# Patient Record
Sex: Female | Born: 1970 | Race: White | Hispanic: No | Marital: Married | Smoking: Never smoker
Health system: Southern US, Community
[De-identification: ages and names within clinical notes are randomized; demographics above are authoritative.]

## PROBLEM LIST (undated history)

## (undated) DIAGNOSIS — K603 Anal fistula, unspecified: Secondary | ICD-10-CM

## (undated) DIAGNOSIS — Z8489 Family history of other specified conditions: Secondary | ICD-10-CM

## (undated) HISTORY — PX: WISDOM TOOTH EXTRACTION: SHX21

---

## 1998-08-27 ENCOUNTER — Observation Stay (HOSPITAL_COMMUNITY): Admission: AD | Admit: 1998-08-27 | Discharge: 1998-08-27 | Payer: Self-pay | Admitting: Obstetrics & Gynecology

## 1998-11-01 ENCOUNTER — Inpatient Hospital Stay (HOSPITAL_COMMUNITY): Admission: AD | Admit: 1998-11-01 | Discharge: 1998-11-01 | Payer: Self-pay | Admitting: Obstetrics and Gynecology

## 1999-01-28 ENCOUNTER — Inpatient Hospital Stay (HOSPITAL_COMMUNITY): Admission: AD | Admit: 1999-01-28 | Discharge: 1999-01-28 | Payer: Self-pay | Admitting: Obstetrics and Gynecology

## 1999-03-31 ENCOUNTER — Inpatient Hospital Stay (HOSPITAL_COMMUNITY): Admission: AD | Admit: 1999-03-31 | Discharge: 1999-04-03 | Payer: Self-pay | Admitting: Obstetrics and Gynecology

## 1999-04-23 ENCOUNTER — Encounter (HOSPITAL_COMMUNITY): Admission: RE | Admit: 1999-04-23 | Discharge: 1999-07-22 | Payer: Self-pay | Admitting: Obstetrics and Gynecology

## 1999-05-12 ENCOUNTER — Other Ambulatory Visit: Admission: RE | Admit: 1999-05-12 | Discharge: 1999-05-12 | Payer: Self-pay | Admitting: Obstetrics and Gynecology

## 2000-05-17 ENCOUNTER — Other Ambulatory Visit: Admission: RE | Admit: 2000-05-17 | Discharge: 2000-05-17 | Payer: Self-pay | Admitting: Obstetrics and Gynecology

## 2001-06-14 ENCOUNTER — Other Ambulatory Visit: Admission: RE | Admit: 2001-06-14 | Discharge: 2001-06-14 | Payer: Self-pay | Admitting: Obstetrics and Gynecology

## 2001-07-13 ENCOUNTER — Ambulatory Visit (HOSPITAL_COMMUNITY): Admission: AD | Admit: 2001-07-13 | Discharge: 2001-07-13 | Payer: Self-pay | Admitting: *Deleted

## 2001-07-13 ENCOUNTER — Encounter (INDEPENDENT_AMBULATORY_CARE_PROVIDER_SITE_OTHER): Payer: Self-pay

## 2001-07-13 HISTORY — PX: DILATION AND EVACUATION: SHX1459

## 2002-07-01 ENCOUNTER — Inpatient Hospital Stay (HOSPITAL_COMMUNITY): Admission: AD | Admit: 2002-07-01 | Discharge: 2002-07-04 | Payer: Self-pay | Admitting: Obstetrics and Gynecology

## 2002-08-08 ENCOUNTER — Other Ambulatory Visit: Admission: RE | Admit: 2002-08-08 | Discharge: 2002-08-08 | Payer: Self-pay | Admitting: Obstetrics and Gynecology

## 2003-08-22 ENCOUNTER — Other Ambulatory Visit: Admission: RE | Admit: 2003-08-22 | Discharge: 2003-08-22 | Payer: Self-pay | Admitting: Obstetrics and Gynecology

## 2005-06-09 ENCOUNTER — Other Ambulatory Visit: Admission: RE | Admit: 2005-06-09 | Discharge: 2005-06-09 | Payer: Self-pay | Admitting: Obstetrics and Gynecology

## 2009-12-11 ENCOUNTER — Encounter: Admission: RE | Admit: 2009-12-11 | Discharge: 2009-12-11 | Payer: Self-pay | Admitting: Family Medicine

## 2011-01-16 NOTE — Op Note (Signed)
[facility]  Patient:    Brenda Flowers, Brenda Flowers Visit Number: [id number] MRN: [id number]          Service Type: DSU Location: [facility] Attending Physician:  Donne Hazel Dictated by:   Trevor Iha, M.D. Proc. Date: 07/13/01 Admit Date:  07/13/2001                             Operative Report  PREOPERATIVE DIAGNOSES:       Incomplete abortion [redacted] weeks gestational age.  POSTOPERATIVE DIAGNOSES:      Incomplete abortion [redacted] weeks gestational age.  PROCEDURE:                    Dilatation and evacuation.  SURGEON:                      Trevor Iha, M.D.  ANESTHESIA:                   Monitored anesthetic care.  INDICATIONS:                  [name] is a 40 year old G3, P2 at 65 1/2 weeks estimated gestational age who presented with bright bleeding, cramping, and passing of clots.  The passed the fetus upon arrival to the emergency room.  Continued to have heavy bleeding.  Because of incomplete abortion she was dilated 2 cm, we recommended she proceed with dilatation and evacuation for the incomplete abortion.  Risks and benefits were discussed at length. Informed consent was obtained.  Her pregnancy has been complicated by a positive toxoplasmosis titer and infection and also with vaginal bleeding.  DESCRIPTION OF PROCEDURE:     Approximately 300 cc was noted before the procedure of blood loss.  Upon taking her to the operating room and after adequate analgesia with the patient placed in the dorsal lithotomy position she is sterilely prepped and draped.  The bladder is sterilely drained. Approximately 300-400 cc more was evacuated from the vagina.  After sterile prepping and draping some tissue was removed from the cervix.  Polyp forceps were used to grasp remaining endometrial placental tissue.  This was followed by suction curettage with 8 mm suction curette retrieving products of conception.  This is performed until the endometrial  cavity is felt to be emptied as evidenced by a gritty surface felt throughout the endometrial cavity.  At this point methergine 0.2 mg is given to the patient IM with good uterine response.  At this time felt endometrial cavity was empty.  The suction curette was removed.  The sponge forceps was removed from the anterior lip of the cervix and noted to be hemostatic.  The Graves speculum is then removed.  The patient is transferred to the recovery room in stable condition. Estimated blood loss total was 300 cc before procedure, 400 cc noted at the time of the procedure for a total of 700 cc blood loss.  She was in stable condition in the PACU and will be discharged home.  DISPOSITION:                  Patient will be discharged home.  Will follow up in the office in two to three weeks.  She is sent home with a routine instruction sheet for D&C and a prescription for methergine 0.2 mg to take q.8h. for three days and doxycycline 100 mg p.o. b.i.d. x  7 days. Dictated by:   Trevor Iha, M.D. Attending Physician:  Donne Hazel DD:  07/13/01 TD:  07/13/01 Job: [id number]

## 2011-09-14 DIAGNOSIS — F988 Other specified behavioral and emotional disorders with onset usually occurring in childhood and adolescence: Secondary | ICD-10-CM | POA: Insufficient documentation

## 2013-11-02 DIAGNOSIS — J45990 Exercise induced bronchospasm: Secondary | ICD-10-CM | POA: Insufficient documentation

## 2014-11-14 ENCOUNTER — Encounter: Payer: Self-pay | Admitting: Sports Medicine

## 2014-11-14 ENCOUNTER — Ambulatory Visit (INDEPENDENT_AMBULATORY_CARE_PROVIDER_SITE_OTHER): Payer: BLUE CROSS/BLUE SHIELD | Admitting: Sports Medicine

## 2014-11-14 ENCOUNTER — Ambulatory Visit
Admission: RE | Admit: 2014-11-14 | Discharge: 2014-11-14 | Disposition: A | Discharge status: Home / Self Care | Payer: BLUE CROSS/BLUE SHIELD | Source: Ambulatory Visit | Attending: Sports Medicine | Admitting: Sports Medicine

## 2014-11-14 VITALS — BP 101/70 | Ht 68.0 in | Wt 160.0 lb

## 2014-11-14 DIAGNOSIS — M2170 Unequal limb length (acquired), unspecified site: Secondary | ICD-10-CM

## 2014-11-14 DIAGNOSIS — R1031 Right lower quadrant pain: Secondary | ICD-10-CM

## 2014-11-14 DIAGNOSIS — M25551 Pain in right hip: Secondary | ICD-10-CM

## 2014-11-14 DIAGNOSIS — R269 Unspecified abnormalities of gait and mobility: Secondary | ICD-10-CM

## 2014-11-14 DIAGNOSIS — M79604 Pain in right leg: Secondary | ICD-10-CM

## 2014-11-14 DIAGNOSIS — R109 Unspecified abdominal pain: Secondary | ICD-10-CM

## 2014-11-14 DIAGNOSIS — G8929 Other chronic pain: Secondary | ICD-10-CM | POA: Insufficient documentation

## 2014-11-14 NOTE — Assessment & Plan Note (Signed)
Soft foam correction added to left insole This was comfortabel with running

## 2014-11-14 NOTE — Assessment & Plan Note (Signed)
I think this is being triggered by biomechanical issues  XRay looks pretty good and I think intra-articular pathology less likely

## 2014-11-14 NOTE — Assessment & Plan Note (Signed)
Leg length corection and bilat 5th ray posts added to sports insoles  After this her gait looked improved with less turnout  Add line drills  Use correction x 1 month and monitor sxs  Reck after 1 month  May require custom orthotics

## 2014-11-14 NOTE — Progress Notes (Signed)
Patient ID: Brenda Flowers, female   DOB: 06/13/1971, 44 y.o.   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 first year 2 stress fractures on RT tibia anterior Year 4 had PF on RT and took a long time/ boot / finally heeled and no pain after 2 years ago had femoral neck stress fx on RT/ crutches 2 weeks/ total rest 10 weeks  Mileage 25 per week/ 40 when doing marathon 4 days running w long run 8 to [employer]14 Cross train on pure bar Lifts - mainly upper body  Now has pain in groin at night/ stretching makes that worse Last week on 11 mile run had groin pain at mile 8  Sitting makes stiff/ riding in a car/ getting out of car Some stiffness in morning  Premenopausal with periods twice monthly x 3 mos/ HGB and Iron were OK  Fam HX Dad has OA of knees  WzM NAD/ MUSCULAR BP 101/70 mmHg  Ht 5\' 8"  (1.727 m)  Wt 160 lb (72.576 kg)  BMI 24.33 kg/m2  LMP 11/07/2014 (Approximate)  RT leg is 1 cm longer No scoliosis  RT SIJ is tight No hernia on RT No pain over symphysis  Hip rotatoin is full bilaterally Slt groin pain on RT on FADIR but not clinically impressive Strength excellent on all hip MM groups Hop test neg  Running gait shows bilat fore foot varus foot strike She hits with slt turnout of both feet Some ER of RT hip with running  XR Calcified bone island? Thickening of RT fem cortex

## 2014-12-20 ENCOUNTER — Encounter: Payer: Self-pay | Admitting: Sports Medicine

## 2014-12-20 ENCOUNTER — Ambulatory Visit (INDEPENDENT_AMBULATORY_CARE_PROVIDER_SITE_OTHER): Payer: BLUE CROSS/BLUE SHIELD | Admitting: Sports Medicine

## 2014-12-20 VITALS — BP 101/66 | Ht 68.0 in | Wt 150.0 lb

## 2014-12-20 DIAGNOSIS — G8929 Other chronic pain: Secondary | ICD-10-CM

## 2014-12-20 DIAGNOSIS — R109 Unspecified abdominal pain: Secondary | ICD-10-CM

## 2014-12-20 DIAGNOSIS — M25551 Pain in right hip: Secondary | ICD-10-CM | POA: Diagnosis not present

## 2014-12-20 DIAGNOSIS — R1031 Right lower quadrant pain: Secondary | ICD-10-CM

## 2014-12-20 MED ORDER — MELOXICAM 15 MG PO TABS: 15.0000 mg | ORAL_TABLET | Freq: Every day | ORAL | Status: DC | PRN

## 2014-12-20 NOTE — Assessment & Plan Note (Signed)
MRI to reevaluate for potential recurrent stress fracture versus AVN. Will call patient with the results of the MRI to discuss options at that time. She will likely need some bio mechanical modifications to her stride as well as shoes  & will reevaluate after the MRI.

## 2014-12-20 NOTE — Progress Notes (Signed)
  Brenda GarbeCherie N Flowers - 44 y.o. female MRN [id number]  Date of birth: 07-03-1971  SUBJECTIVE: CC: 1. Right Leg and Hip Pain, follow up     HPI:   previously seen 1 month ago for symptoms that are "the same"  Pain is worse at the end of the long run  Located circumferentially around the proximal thigh  No significant radiation  Pain does not awaken her from sleep. She denies any fevers, chills or recent weight gain or weight loss. Pain is worse with forward flexion and 1 footed standing at the end of the long run.  Previously pain was controlled with Celebrex but no significant change with current Aleeve     ROS:  per HPI    HISTORY:  Past Medical, Surgical, Social, and Family History reviewed & updated per EMR.  Pertinent Historical Findings include: Social History   Occupational History  .      [employer]   Social History Main Topics  . Smoking status: Never Smoker   . Smokeless tobacco: Not on file  . Alcohol Use: Not on file  . Drug Use: Not on file  . Sexual Activity: Not on file    Avid runner, Works at [employer] Has completed multiple half and full marathons  OBJECTIVE:  VS:   HT:5\' 8"  (172.7 cm)   WT:150 lb (68.04 kg)  BMI:22.9          BP:101/66 mmHg  HR: bpm  TEMP: ( )  RESP:   PHYSICAL EXAM: GENERAL: Adult Caucasian female. No acute distress PSYCH: Alert and appropriately interactive. SKIN: No open skin lesions or abnormal skin markings on areas inspected as below VASCULAR: DP and PT pulses 2+/4 NEURO: Lower extremity strength is 5+/5 in all myotomes; sensation is intact to light touch in all dermatomes. RIGHT HIP: Overall normally aligned. She does have a small amount of swelling over the proximal lateral thigh. Noted tenderness palpation in this area. TTP over rectus femoris origin and TFL. No significant TTP over greater trochanter, piriformis. Normal internal and external rotation of bilateral hips. Negative log roll. Positive Faber on the  right, slight pain with terminal FADIR. Popliteal angle of 70 on right, 90 on left. Positive fulcrum test. GAIT: Toe striker with a wide-based stance, does not cross midline. No significant Trendelenburg.  DATA OBTAINED: No notes on file   ASSESSMENT & PLAN: See problem based charting & AVS for additional documentation Problem List Items Addressed This Visit    Groin pain, chronic, right    MRI to reevaluate for potential recurrent stress fracture versus AVN. Will call patient with the results of the MRI to discuss options at that time. She will likely need some bio mechanical modifications to her stride as well as shoes  & will reevaluate after the MRI.      Relevant Medications   meloxicam (MOBIC) 15 MG tablet    Other Visit Diagnoses    Right hip pain    -  Primary    Relevant Medications    meloxicam (MOBIC) 15 MG tablet    Other Relevant Orders    MR Hip Right Wo Contrast       FOLLOW UP:  Return for Will call with results.

## 2014-12-24 ENCOUNTER — Ambulatory Visit (INDEPENDENT_AMBULATORY_CARE_PROVIDER_SITE_OTHER): Payer: BLUE CROSS/BLUE SHIELD | Admitting: Physician Assistant

## 2014-12-24 VITALS — BP 110/66 | HR 69 | Temp 98.9°F | Resp 16 | Ht 69.5 in | Wt 151.4 lb

## 2014-12-24 DIAGNOSIS — K645 Perianal venous thrombosis: Secondary | ICD-10-CM | POA: Diagnosis not present

## 2014-12-24 DIAGNOSIS — K612 Anorectal abscess: Secondary | ICD-10-CM | POA: Diagnosis not present

## 2014-12-24 MED ORDER — CIPROFLOXACIN HCL 500 MG PO TABS
500.0000 mg | ORAL_TABLET | Freq: Two times a day (BID) | ORAL | Status: DC
Start: 2014-12-24 — End: 2015-02-19

## 2014-12-24 MED ORDER — NITROGLYCERIN 0.4 % RE OINT: TOPICAL_OINTMENT | RECTAL | Status: DC

## 2014-12-24 MED ORDER — HYDROCORTISONE 2.5 % RE CREA: 1.0000 "application " | TOPICAL_CREAM | Freq: Two times a day (BID) | RECTAL | Status: DC

## 2014-12-24 MED ORDER — HYDROCODONE-ACETAMINOPHEN 5-325 MG PO TABS: 1.0000 | ORAL_TABLET | Freq: Four times a day (QID) | ORAL | Status: DC | PRN

## 2014-12-24 NOTE — Patient Instructions (Signed)
Please apply the steroid suppository twice daily and the nitro topical twice daily.  Please take the cipro twice daily for 10 days.  Please take the norco as needed. If you start feeling nauseated from this please let us know and I'll send in a different pain medication for you. Please come back to see me on Wednesday. We'll assess at that point and determine the next step.   Hemorrhoids Hemorrhoids are swollen veins around the rectum or anus. There are two types of hemorrhoids:   Internal hemorrhoids. These occur in the veins just inside the rectum. They may poke through to the outside and become irritated and painful.  External hemorrhoids. These occur in the veins outside the anus and can be felt as a painful swelling or hard lump near the anus. CAUSES  Pregnancy.   Obesity.   Constipation or diarrhea.   Straining to have a bowel movement.   Sitting for long periods on the toilet.  Heavy lifting or other activity that caused you to strain.  Anal intercourse. SYMPTOMS   Pain.   Anal itching or irritation.   Rectal bleeding.   Fecal leakage.   Anal swelling.   One or more lumps around the anus.  DIAGNOSIS  Your caregiver may be able to diagnose hemorrhoids by visual examination. Other examinations or tests that may be performed include:   Examination of the rectal area with a gloved hand (digital rectal exam).   Examination of anal canal using a small tube (scope).   A blood test if you have lost a significant amount of blood.  A test to look inside the colon (sigmoidoscopy or colonoscopy). TREATMENT Most hemorrhoids can be treated at home. However, if symptoms do not seem to be getting better or if you have a lot of rectal bleeding, your caregiver may perform a procedure to help make the hemorrhoids get smaller or remove them completely. Possible treatments include:   Placing a rubber band at the base of the hemorrhoid to cut off the circulation  (rubber band ligation).   Injecting a chemical to shrink the hemorrhoid (sclerotherapy).   Using a tool to burn the hemorrhoid (infrared light therapy).   Surgically removing the hemorrhoid (hemorrhoidectomy).   Stapling the hemorrhoid to block blood flow to the tissue (hemorrhoid stapling).  HOME CARE INSTRUCTIONS   Eat foods with fiber, such as whole grains, beans, nuts, fruits, and vegetables. Ask your doctor about taking products with added fiber in them (fibersupplements).  Increase fluid intake. Drink enough water and fluids to keep your urine clear or pale yellow.   Exercise regularly.   Go to the bathroom when you have the urge to have a bowel movement. Do not wait.   Avoid straining to have bowel movements.   Keep the anal area dry and clean. Use wet toilet paper or moist towelettes after a bowel movement.   Medicated creams and suppositories may be used or applied as directed.   Only take over-the-counter or prescription medicines as directed by your caregiver.   Take warm sitz baths for 15-20 minutes, 3-4 times a day to ease pain and discomfort.   Place ice packs on the hemorrhoids if they are tender and swollen. Using ice packs between sitz baths may be helpful.   Put ice in a plastic bag.   Place a towel between your skin and the bag.   Leave the ice on for 15-20 minutes, 3-4 times a day.   Do not use a donut-shaped pillow  or sit on the toilet for long periods. This increases blood pooling and pain.  SEEK MEDICAL CARE IF:  You have increasing pain and swelling that is not controlled by treatment or medicine.  You have uncontrolled bleeding.  You have difficulty or you are unable to have a bowel movement.  You have pain or inflammation outside the area of the hemorrhoids. MAKE SURE YOU:  Understand these instructions.  Will watch your condition.  Will get help right away if you are not doing well or get worse. Document Released:  08/14/2000 Document Revised: 08/03/2012 Document Reviewed: 06/21/2012 [facility] Patient Information 2015 [place], [place]. This information is not intended to replace advice given to you by your health care provider. Make sure you discuss any questions you have with your health care provider.

## 2014-12-24 NOTE — Progress Notes (Signed)
   Subjective:    Patient ID: Brenda Flowers, female    DOB: [birth year], 44 y.o.   MRN: [id number]  Chief Complaint  Patient presents with  . Rectal Pain    worse after using the bathroom. x 1 month    Patient Active Problem List   Diagnosis Date Noted  . Groin pain, chronic, right 11/14/2014  . Leg length inequality 11/14/2014  . Abnormality of gait 11/14/2014  . Asthma, exercise induced 11/02/2013  . ADD (attention deficit disorder) 09/14/2011   Medications, allergies, past medical history, surgical history, family history, social history and problem list reviewed and updated.  HPI  [age] yof with no pertinent pmh presents with rectal pain.   Sx intermittent for approx 6 wks. Gets severe pain/burning sensation on right side anus with BMs, leaning back while seated, or with certain movements.   Intermittent. When sx come on typically last for several mins-hour. Having BMs is the most painful. Has had hemorrhoids in distant past when pregnant, feels similar but more painful now. Doing sitz baths without relief. Taking meloxicam for hip pain which doesn't help with the anal pain.   Denies hx IBD, blood in stool, diarrhea, fevers, chills, vaginal dc.   Review of Systems See HPI.     Objective:   Physical Exam  Constitutional: She is oriented to person, place, and time. She appears well-developed and well-nourished.  Non-toxic appearance. She does not have a sickly appearance. She does not appear ill. No distress.  BP 110/66 mmHg  Pulse 69  Temp(Src) 98.9 F (37.2 C) (Oral)  Resp 16  Ht 5' 9.5" (1.765 m)  Wt 151 lb 6.4 oz (68.675 kg)  BMI 22.04 kg/m2  SpO2 98%  LMP 12/11/2014   Genitourinary: Rectal exam shows external hemorrhoid and tenderness. Rectal exam shows no fissure and anal tone normal.  External hemorrhoid extending from 2:00 to 6:00 around anus. Two small thrombus palpable in hemorrhoid very ttp. Area of tenderness and mild induration extending from proximal to anus  into anal canal. No fluctuance. No purulence. No erythema. Normal anal tone. No fissure. No ttp over sacrum.   Neurological: She is alert and oriented to person, place, and time.  Psychiatric: She has a normal mood and affect. Her speech is normal and behavior is normal.      Assessment & Plan:   [age] yof with no pertinent pmh presents with rectal pain.   External hemorrhoid, thrombosed - Plan: hydrocortisone (ANUSOL-HC) 2.5 % rectal cream, Nitroglycerin 0.4 % OINT, HYDROcodone-acetaminophen (NORCO) 5-325 MG per tablet --Two small thrombus in external hemorrhoid --continue sitz baths, no lingering on toilet, colace --hydrocortisone suppository bid, nitro ointment bid --norco prn --> pt to call if n/v with med as has had previously but not every time with opiates --rtc 48 hrs for eval, no indication for incision at this time   Abscess of anal and rectal regions - Plan: ciprofloxacin (CIPRO) 500 MG tablet, HYDROcodone-acetaminophen (NORCO) 5-325 MG per tablet --possible abscess near anus with induration and ttp, no fluctuance --no fevers, chills --cipro bid 10 days --rtc 48 hrs for eval, if progressing possible surgical referral at that time  Donnajean Lopesodd M. Lucille Crichlow, PA-C Physician Assistant-Certified [facility]  12/24/2014 9:16 PM

## 2014-12-26 ENCOUNTER — Ambulatory Visit
Admission: RE | Admit: 2014-12-26 | Discharge: 2014-12-26 | Disposition: A | Discharge status: Home / Self Care | Payer: BLUE CROSS/BLUE SHIELD | Source: Ambulatory Visit | Attending: Sports Medicine | Admitting: Sports Medicine

## 2014-12-26 DIAGNOSIS — M25551 Pain in right hip: Secondary | ICD-10-CM

## 2014-12-30 ENCOUNTER — Telehealth: Payer: Self-pay

## 2014-12-30 NOTE — Telephone Encounter (Signed)
Pt was seen by Raelyn Ensigntodd [email or web address] [phone] for a hemorrhoid and she wanted to let him know that the rx he prescribed has provided her with some relief, however she still occasionally experiences some painful episodes. She wants to know if Tawanna Coolerodd recommends that she RTC to receive further tx or if she should let the tx run its course.

## 2015-01-04 ENCOUNTER — Ambulatory Visit: Payer: BLUE CROSS/BLUE SHIELD | Admitting: Sports Medicine

## 2015-01-23 ENCOUNTER — Ambulatory Visit: Payer: BLUE CROSS/BLUE SHIELD | Admitting: Sports Medicine

## 2015-02-05 ENCOUNTER — Ambulatory Visit (INDEPENDENT_AMBULATORY_CARE_PROVIDER_SITE_OTHER): Payer: BLUE CROSS/BLUE SHIELD | Admitting: Sports Medicine

## 2015-02-05 ENCOUNTER — Encounter: Payer: Self-pay | Admitting: Sports Medicine

## 2015-02-05 VITALS — BP 112/70 | Ht 68.0 in | Wt 145.0 lb

## 2015-02-05 DIAGNOSIS — M76899 Other specified enthesopathies of unspecified lower limb, excluding foot: Secondary | ICD-10-CM | POA: Insufficient documentation

## 2015-02-05 DIAGNOSIS — M6580 Other synovitis and tenosynovitis, unspecified site: Secondary | ICD-10-CM | POA: Diagnosis not present

## 2015-02-05 MED ORDER — NITROGLYCERIN 0.2 MG/HR TD PT24: MEDICATED_PATCH | TRANSDERMAL | Status: DC

## 2015-02-05 NOTE — Progress Notes (Signed)
Patient ID: Brenda Flowers, female   DOB: 01/02/1971, 44 y.o.   MRN: [id number]   HPI  Patient presents today for follow up R hip/hamstring pain  Patient explains approx 1 year of perceived circumfirential R hip pain located in R lateral hip and R lower buttock. It is worse with running intermittently and she has stiffness after prolonged sitting. She has tried a thigh sleeve which helps but doesn't seem to be high enough  Since her last visit she states that her pain is stable to slightly improved.   She also notes an indention in her R anterolateral thigh which ahs been there several months. The pain seems to be originating from that area  She has had an MRI since last visit and continues to run approx 20-25 miles per week  MRI revealed an unremarkable hip but a small proximal right hamstring tear  ROS: Per HPI  Objective: BP 112/70 mmHg  Ht 5\' 8"  (1.727 m)  Wt 145 lb (65.772 kg)  BMI 22.05 kg/m2 Gen: NAD, alert, cooperative with exam HEENT: NCAT, EOMI, PERRL Ext: No edema, warm Neuro: Alert and oriented, No gross deficits R hip Strength 5/5 in all areas except 3-4/5 in stressing the hamstring on the R H Test is limited by about 10 on the right Full ROM BL hips Negative FABER  Assessment and plan:  Hamstring tendinitis at origin Continued but improved pain MRI with partial tear at insertion, clinically hamstring tendinopathy Given HEP Nitro patch, f/u 4-6 weeks      Meds ordered this encounter  Medications  . nitroGLYCERIN (NITRODUR - DOSED IN MG/24 HR) 0.2 mg/hr patch    Sig: Use 1/4 patch daily to the affected area    Dispense:  10 patch    Refill:  1  . methylphenidate 54 MG PO CR tablet    Sig: Take 54 mg by mouth.

## 2015-02-05 NOTE — Patient Instructions (Addendum)
  Exercises,  4 sets of 8 each: Extender exercises  Diver exercise  3 sets of 15 Runner's lunge and reverse lunge    Nitroglycerin Protocol   Apply 1/4 nitroglycerin patch to affected area daily.  Change position of patch within the affected area every 24 hours.  You may experience a headache during the first 1-2 weeks of using the patch, these should subside.  If you experience headaches after beginning nitroglycerin patch treatment, you may take your preferred over the counter pain reliever.  Another side effect of the nitroglycerin patch is skin irritation or rash related to patch adhesive.  Please notify our office if you develop more severe headaches or rash, and stop the patch.  Tendon healing with nitroglycerin patch may require 12 to 24 weeks depending on the extent of injury.  Men should not use if taking Viagra, Cialis, or Levitra.   Do not use if you have migraines or rosacea.

## 2015-02-05 NOTE — Assessment & Plan Note (Addendum)
Continued but improved pain MRI with partial tear at insertion, clinically hamstring tendinopathy Given HEP Nitro patch, f/u 4-6 weeks

## 2015-02-18 ENCOUNTER — Inpatient Hospital Stay (HOSPITAL_COMMUNITY)
Admission: EM | Admit: 2015-02-18 | Discharge: 2015-02-19 | DRG: 395 | Disposition: A | Discharge status: Home / Self Care | Payer: BLUE CROSS/BLUE SHIELD | Attending: Surgery | Admitting: Surgery

## 2015-02-18 ENCOUNTER — Encounter (HOSPITAL_COMMUNITY): Admission: EM | Disposition: A | Discharge status: Home / Self Care | Payer: Self-pay | Source: Home / Self Care

## 2015-02-18 ENCOUNTER — Emergency Department (HOSPITAL_COMMUNITY): Payer: BLUE CROSS/BLUE SHIELD

## 2015-02-18 ENCOUNTER — Encounter (HOSPITAL_COMMUNITY): Payer: Self-pay | Admitting: Emergency Medicine

## 2015-02-18 ENCOUNTER — Observation Stay (HOSPITAL_COMMUNITY): Payer: BLUE CROSS/BLUE SHIELD | Admitting: Registered Nurse

## 2015-02-18 DIAGNOSIS — Z809 Family history of malignant neoplasm, unspecified: Secondary | ICD-10-CM

## 2015-02-18 DIAGNOSIS — K611 Rectal abscess: Secondary | ICD-10-CM | POA: Diagnosis present

## 2015-02-18 HISTORY — PX: INCISION AND DRAINAGE PERIRECTAL ABSCESS: SHX1804

## 2015-02-18 LAB — I-STAT CHEM 8, ED
BUN: 9 mg/dL (ref 6–20)
CHLORIDE: 105 mmol/L (ref 101–111)
CREATININE: 0.7 mg/dL (ref 0.44–1.00)
Calcium, Ion: 1.14 mmol/L (ref 1.12–1.23)
Glucose, Bld: 105 mg/dL — ABNORMAL HIGH (ref 65–99)
HCT: 37 % (ref 36.0–46.0)
Hemoglobin: 12.6 g/dL (ref 12.0–15.0)
Potassium: 3.5 mmol/L (ref 3.5–5.1)
Sodium: 138 mmol/L (ref 135–145)
TCO2: 20 mmol/L (ref 0–100)

## 2015-02-18 LAB — URINALYSIS, ROUTINE W REFLEX MICROSCOPIC
Bilirubin Urine: NEGATIVE
GLUCOSE, UA: NEGATIVE mg/dL
HGB URINE DIPSTICK: NEGATIVE
KETONES UR: NEGATIVE mg/dL
Leukocytes, UA: NEGATIVE
NITRITE: NEGATIVE
PROTEIN: NEGATIVE mg/dL
Specific Gravity, Urine: 1.018 (ref 1.005–1.030)
Urobilinogen, UA: 0.2 mg/dL (ref 0.0–1.0)
pH: 6 (ref 5.0–8.0)

## 2015-02-18 LAB — PREGNANCY, URINE: Preg Test, Ur: NEGATIVE

## 2015-02-18 LAB — CBC
HCT: 35.2 % — ABNORMAL LOW (ref 36.0–46.0)
Hemoglobin: 11.8 g/dL — ABNORMAL LOW (ref 12.0–15.0)
MCH: 30.4 pg (ref 26.0–34.0)
MCHC: 33.5 g/dL (ref 30.0–36.0)
MCV: 90.7 fL (ref 78.0–100.0)
PLATELETS: 265 10*3/uL (ref 150–400)
RBC: 3.88 MIL/uL (ref 3.87–5.11)
RDW: 12.8 % (ref 11.5–15.5)
WBC: 13.4 10*3/uL — ABNORMAL HIGH (ref 4.0–10.5)

## 2015-02-18 LAB — SURGICAL PCR SCREEN
MRSA, PCR: NEGATIVE
Staphylococcus aureus: NEGATIVE

## 2015-02-18 SURGERY — INCISION AND DRAINAGE, ABSCESS, PERIRECTAL
Anesthesia: General

## 2015-02-18 MED ORDER — KCL IN DEXTROSE-NACL 20-5-0.45 MEQ/L-%-% IV SOLN
INTRAVENOUS | Status: AC
  Filled 2015-02-18: qty 1000

## 2015-02-18 MED ORDER — ACETAMINOPHEN 325 MG PO TABS
650.0000 mg | ORAL_TABLET | Freq: Four times a day (QID) | ORAL | Status: DC | PRN
  Administered 2015-02-18 – 2015-02-19 (×2): 650 mg via ORAL
  Filled 2015-02-18 (×2): qty 2

## 2015-02-18 MED ORDER — BUPIVACAINE-EPINEPHRINE 0.5% -1:200000 IJ SOLN
INTRAMUSCULAR | Status: DC | PRN
  Administered 2015-02-18: 8 mL

## 2015-02-18 MED ORDER — ONDANSETRON HCL 4 MG/2ML IJ SOLN
4.0000 mg | Freq: Once | INTRAMUSCULAR | Status: AC
  Administered 2015-02-18: 4 mg via INTRAVENOUS
  Filled 2015-02-18: qty 2

## 2015-02-18 MED ORDER — KCL IN DEXTROSE-NACL 20-5-0.45 MEQ/L-%-% IV SOLN
INTRAVENOUS | Status: DC
  Administered 2015-02-19: via INTRAVENOUS
  Filled 2015-02-18 (×4): qty 1000

## 2015-02-18 MED ORDER — HYDROMORPHONE HCL 1 MG/ML IJ SOLN
INTRAMUSCULAR | Status: AC
  Filled 2015-02-18: qty 1

## 2015-02-18 MED ORDER — SODIUM CHLORIDE 0.9 % IV BOLUS (SEPSIS)
1000.0000 mL | Freq: Once | INTRAVENOUS | Status: AC
Start: 2015-02-18 — End: 2015-02-18
  Administered 2015-02-18: 1000 mL via INTRAVENOUS

## 2015-02-18 MED ORDER — NEOSTIGMINE METHYLSULFATE 10 MG/10ML IV SOLN
INTRAVENOUS | Status: DC | PRN
  Administered 2015-02-18: 3.5 mg via INTRAVENOUS

## 2015-02-18 MED ORDER — KCL IN DEXTROSE-NACL 20-5-0.45 MEQ/L-%-% IV SOLN
INTRAVENOUS | Status: DC
  Filled 2015-02-18 (×2): qty 1000

## 2015-02-18 MED ORDER — GLYCOPYRROLATE 0.2 MG/ML IJ SOLN
INTRAMUSCULAR | Status: DC | PRN
  Administered 2015-02-18: .7 mg via INTRAVENOUS

## 2015-02-18 MED ORDER — ONDANSETRON HCL 4 MG/2ML IJ SOLN
INTRAMUSCULAR | Status: AC
  Filled 2015-02-18: qty 2

## 2015-02-18 MED ORDER — LACTATED RINGERS IV SOLN
INTRAVENOUS | Status: DC | PRN
  Administered 2015-02-18: 09:00:00 via INTRAVENOUS

## 2015-02-18 MED ORDER — SUCCINYLCHOLINE CHLORIDE 20 MG/ML IJ SOLN
INTRAMUSCULAR | Status: DC | PRN
  Administered 2015-02-18: 100 mg via INTRAVENOUS

## 2015-02-18 MED ORDER — PROPOFOL 10 MG/ML IV BOLUS
INTRAVENOUS | Status: DC | PRN
  Administered 2015-02-18: 170 mg via INTRAVENOUS

## 2015-02-18 MED ORDER — GLYCOPYRROLATE 0.2 MG/ML IJ SOLN
INTRAMUSCULAR | Status: AC
  Filled 2015-02-18: qty 3

## 2015-02-18 MED ORDER — ROCURONIUM BROMIDE 100 MG/10ML IV SOLN
INTRAVENOUS | Status: AC
  Filled 2015-02-18: qty 1

## 2015-02-18 MED ORDER — MORPHINE SULFATE 4 MG/ML IJ SOLN
4.0000 mg | Freq: Once | INTRAMUSCULAR | Status: AC
  Administered 2015-02-18: 4 mg via INTRAVENOUS
  Filled 2015-02-18: qty 1

## 2015-02-18 MED ORDER — SODIUM CHLORIDE 0.9 % IV SOLN
3.0000 g | Freq: Four times a day (QID) | INTRAVENOUS | Status: DC
  Administered 2015-02-18 – 2015-02-19 (×5): 3 g via INTRAVENOUS
  Filled 2015-02-18 (×7): qty 3

## 2015-02-18 MED ORDER — NEOSTIGMINE METHYLSULFATE 10 MG/10ML IV SOLN
INTRAVENOUS | Status: AC
  Filled 2015-02-18: qty 1

## 2015-02-18 MED ORDER — LIDOCAINE HCL (CARDIAC) 20 MG/ML IV SOLN
INTRAVENOUS | Status: AC
  Filled 2015-02-18: qty 5

## 2015-02-18 MED ORDER — PROPOFOL 10 MG/ML IV BOLUS
INTRAVENOUS | Status: AC
  Filled 2015-02-18: qty 20

## 2015-02-18 MED ORDER — FENTANYL CITRATE (PF) 100 MCG/2ML IJ SOLN
INTRAMUSCULAR | Status: DC | PRN
  Administered 2015-02-18: 50 ug via INTRAVENOUS

## 2015-02-18 MED ORDER — MORPHINE SULFATE 2 MG/ML IJ SOLN
1.0000 mg | INTRAMUSCULAR | Status: DC | PRN
  Administered 2015-02-18 – 2015-02-19 (×5): 2 mg via INTRAVENOUS
  Filled 2015-02-18 (×6): qty 1

## 2015-02-18 MED ORDER — ROCURONIUM BROMIDE 100 MG/10ML IV SOLN
INTRAVENOUS | Status: DC | PRN
  Administered 2015-02-18: 20 mg via INTRAVENOUS

## 2015-02-18 MED ORDER — MIDAZOLAM HCL 5 MG/5ML IJ SOLN
INTRAMUSCULAR | Status: DC | PRN
Start: 2015-02-18 — End: 2015-02-18
  Administered 2015-02-18: 2 mg via INTRAVENOUS

## 2015-02-18 MED ORDER — IOHEXOL 300 MG/ML  SOLN
100.0000 mL | Freq: Once | INTRAMUSCULAR | Status: AC | PRN
  Administered 2015-02-18: 80 mL via INTRAVENOUS

## 2015-02-18 MED ORDER — MIDAZOLAM HCL 2 MG/2ML IJ SOLN
INTRAMUSCULAR | Status: AC
  Filled 2015-02-18: qty 2

## 2015-02-18 MED ORDER — LIDOCAINE HCL (CARDIAC) 20 MG/ML IV SOLN
INTRAVENOUS | Status: DC | PRN
  Administered 2015-02-18: 100 mg via INTRAVENOUS

## 2015-02-18 MED ORDER — ACETAMINOPHEN 650 MG RE SUPP: 650.0000 mg | Freq: Four times a day (QID) | RECTAL | Status: DC | PRN

## 2015-02-18 MED ORDER — BUPIVACAINE-EPINEPHRINE (PF) 0.5% -1:200000 IJ SOLN
INTRAMUSCULAR | Status: AC
  Filled 2015-02-18: qty 30

## 2015-02-18 MED ORDER — FENTANYL CITRATE (PF) 250 MCG/5ML IJ SOLN
INTRAMUSCULAR | Status: AC
  Filled 2015-02-18: qty 5

## 2015-02-18 MED ORDER — HYDROMORPHONE HCL 1 MG/ML IJ SOLN
0.2500 mg | INTRAMUSCULAR | Status: DC | PRN
  Administered 2015-02-18: 0.5 mg via INTRAVENOUS
  Administered 2015-02-18: 11:00:00 via INTRAVENOUS
  Administered 2015-02-18 (×2): 0.5 mg via INTRAVENOUS
  Administered 2015-02-18: 11:00:00 via INTRAVENOUS

## 2015-02-18 MED ORDER — ONDANSETRON HCL 4 MG/2ML IJ SOLN
INTRAMUSCULAR | Status: DC | PRN
  Administered 2015-02-18: 4 mg via INTRAVENOUS

## 2015-02-18 SURGICAL SUPPLY — 27 items
BLADE HEX COATED 2.75 (ELECTRODE) ×3 IMPLANT
BLADE SURG 15 STRL LF DISP TIS (BLADE) ×1 IMPLANT
BLADE SURG 15 STRL SS (BLADE) ×2
DRAIN PENROSE 18X1/2 LTX STRL (DRAIN) IMPLANT
DRAIN PENROSE 18X1/4 LTX STRL (WOUND CARE) IMPLANT
DRAPE LAPAROTOMY T 102X78X121 (DRAPES) ×3 IMPLANT
DRSG PAD ABDOMINAL 8X10 ST (GAUZE/BANDAGES/DRESSINGS) ×3 IMPLANT
ELECT REM PT RETURN 9FT ADLT (ELECTROSURGICAL) ×3
ELECTRODE REM PT RTRN 9FT ADLT (ELECTROSURGICAL) ×1 IMPLANT
GAUZE SPONGE 4X4 12PLY STRL (GAUZE/BANDAGES/DRESSINGS) ×3 IMPLANT
GAUZE SPONGE 4X4 16PLY XRAY LF (GAUZE/BANDAGES/DRESSINGS) ×3 IMPLANT
GLOVE ECLIPSE 8.0 STRL XLNG CF (GLOVE) ×12 IMPLANT
GLOVE INDICATOR 8.0 STRL GRN (GLOVE) ×3 IMPLANT
GOWN STRL REUS W/TWL XL LVL3 (GOWN DISPOSABLE) ×6 IMPLANT
KIT BASIN OR (CUSTOM PROCEDURE TRAY) ×3 IMPLANT
LUBRICANT JELLY K Y 4OZ (MISCELLANEOUS) ×3 IMPLANT
NEEDLE HYPO 25X1 1.5 SAFETY (NEEDLE) ×3 IMPLANT
PACK LITHOTOMY IV (CUSTOM PROCEDURE TRAY) ×3 IMPLANT
PEN SKIN MARKING BROAD (MISCELLANEOUS) ×3 IMPLANT
PENCIL BUTTON HOLSTER BLD 10FT (ELECTRODE) ×3 IMPLANT
SPONGE SURGIFOAM ABS GEL 12-7 (HEMOSTASIS) IMPLANT
SUT CHROMIC 3 0 SH 27 (SUTURE) ×3 IMPLANT
SUT SILK 2 0 SH (SUTURE) ×3 IMPLANT
SYR CONTROL 10ML LL (SYRINGE) ×3 IMPLANT
TOWEL OR 17X26 10 PK STRL BLUE (TOWEL DISPOSABLE) ×3 IMPLANT
TOWEL OR NON WOVEN STRL DISP B (DISPOSABLE) ×3 IMPLANT
YANKAUER SUCT BULB TIP 10FT TU (MISCELLANEOUS) ×3 IMPLANT

## 2015-02-18 NOTE — Anesthesia Preprocedure Evaluation (Addendum)
Anesthesia Evaluation  Patient identified by MRN, date of birth, ID band Patient awake    Reviewed: Allergy & Precautions, NPO status , Patient's Chart, lab work & pertinent test results  Airway Mallampati: II  TM Distance: >3 FB Neck ROM: Full    Dental no notable dental hx.    Pulmonary asthma ,  breath sounds clear to auscultation  Pulmonary exam normal       Cardiovascular negative cardio ROS Normal cardiovascular examRhythm:Regular Rate:Normal     Neuro/Psych PSYCHIATRIC DISORDERS  Neuromuscular disease    GI/Hepatic negative GI ROS, Neg liver ROS,   Endo/Other  negative endocrine ROS  Renal/GU negative Renal ROS  negative genitourinary   Musculoskeletal negative musculoskeletal ROS (+)   Abdominal   Peds negative pediatric ROS (+)  Hematology negative hematology ROS (+)   Anesthesia Other Findings   Reproductive/Obstetrics negative OB ROS                             Anesthesia Physical Anesthesia Plan  ASA: II  Anesthesia Plan: General   Post-op Pain Management:    Induction: Intravenous  Airway Management Planned: Oral ETT  Additional Equipment:   Intra-op Plan:   Post-operative Plan: Extubation in OR  Informed Consent: I have reviewed the patients History and Physical, chart, labs and discussed the procedure including the risks, benefits and alternatives for the proposed anesthesia with the patient or authorized representative who has indicated his/her understanding and acceptance.   Dental advisory given  Plan Discussed with: CRNA  Anesthesia Plan Comments:         Anesthesia Quick Evaluation

## 2015-02-18 NOTE — ED Notes (Addendum)
Pt from home c/o rectal pain. Hx of thrombosed hemrrohoids. She reports taking two tylenol today lidocaine and hydrocortisone without relief.

## 2015-02-18 NOTE — Progress Notes (Signed)
Patient seen and examined.  Plan incision and drainage of anorectal abscess in OR under general anesthesia.  Procedure and risks discussed with her and her husband. Possibility of recurrence and fistula discussed with them as well.  They seem to understand and agree with the plan.

## 2015-02-18 NOTE — Anesthesia Procedure Notes (Signed)
Procedure Name: Intubation Date/Time: 02/18/2015 9:52 AM Performed by: Jarvis Newcomer A Pre-anesthesia Checklist: Patient identified, Emergency Drugs available, Suction available, Patient being monitored and Timeout performed Patient Re-evaluated:Patient Re-evaluated prior to inductionOxygen Delivery Method: Circle system utilized Preoxygenation: Pre-oxygenation with 100% oxygen Intubation Type: IV induction Ventilation: Mask ventilation without difficulty Laryngoscope Size: Mac and 4 Grade View: Grade I Tube type: Oral Tube size: 7.5 mm Number of attempts: 1 Airway Equipment and Method: Stylet Placement Confirmation: ETT inserted through vocal cords under direct vision,  positive ETCO2 and breath sounds checked- equal and bilateral Secured at: 21 cm Tube secured with: Tape Dental Injury: Teeth and Oropharynx as per pre-operative assessment

## 2015-02-18 NOTE — H&P (Signed)
Brenda Flowers is an 44 y.o. female.    General Surgery [facility]  Chief Complaint: perianal pain, perirectal abscess  HPI: patient is a 44 yo WF with 2 week history of "hemorrhoids" not responding to creams and stool softeners.  Pain worse last two days with development of swelling in right medial buttock.  Some drainage from rectum.  Normal BM's.  Presented to ER for evaluation.  CT scan shows approx 5 cm abscess in right perirectal location containing air with surrounding inflammation.  General surgery called for management.  History reviewed. No pertinent past medical history.  History reviewed. No pertinent past surgical history.  Family History  Problem Relation Age of Onset  . Cancer Father   . Hyperlipidemia Father   . Cancer Maternal Grandmother   . Cancer Maternal Grandfather    Social History:  reports that she has never smoked. She does not have any smokeless tobacco history on file. She reports that she drinks alcohol. She reports that she does not use illicit drugs.  Allergies:  Allergies  Allergen Reactions  . Hydrocodone-Acetaminophen Nausea And Vomiting     (Not in a hospital admission)  Results for orders placed or performed during the hospital encounter of 02/18/15 (from the past 48 hour(s))  Urinalysis, Routine w reflex microscopic (not at [facility])     Status: Abnormal   Collection Time: 02/18/15  3:18 AM  Result Value Ref Range   Color, Urine YELLOW YELLOW   APPearance CLOUDY (A) CLEAR   Specific Gravity, Urine 1.018 1.005 - 1.030   pH 6.0 5.0 - 8.0   Glucose, UA NEGATIVE NEGATIVE mg/dL   Hgb urine dipstick NEGATIVE NEGATIVE   Bilirubin Urine NEGATIVE NEGATIVE   Ketones, ur NEGATIVE NEGATIVE mg/dL   Protein, ur NEGATIVE NEGATIVE mg/dL   Urobilinogen, UA 0.2 0.0 - 1.0 mg/dL   Nitrite NEGATIVE NEGATIVE   Leukocytes, UA NEGATIVE NEGATIVE    Comment: MICROSCOPIC NOT DONE ON URINES WITH NEGATIVE PROTEIN, BLOOD, LEUKOCYTES, NITRITE, OR  GLUCOSE <1000 mg/dL.  Pregnancy, urine     Status: None   Collection Time: 02/18/15  3:18 AM  Result Value Ref Range   Preg Test, Ur NEGATIVE NEGATIVE    Comment:        THE SENSITIVITY OF THIS METHODOLOGY IS >20 mIU/mL.   I-stat Chem 8, ED     Status: Abnormal   Collection Time: 02/18/15  3:27 AM  Result Value Ref Range   Sodium 138 135 - 145 mmol/L   Potassium 3.5 3.5 - 5.1 mmol/L   Chloride 105 101 - 111 mmol/L   BUN 9 6 - 20 mg/dL   Creatinine, Ser 1.61 0.44 - 1.00 mg/dL   Glucose, Bld 096 (H) 65 - 99 mg/dL   Calcium, Ion 0.45 4.09 - 1.23 mmol/L   TCO2 20 0 - 100 mmol/L   Hemoglobin 12.6 12.0 - 15.0 g/dL   HCT 81.1 91.4 - 78.2 %   Ct Pelvis W Contrast  02/18/2015   CLINICAL DATA:  Rectal pain. Assess for perirectal abscess. Initial encounter.  EXAM: CT PELVIS WITH CONTRAST  TECHNIQUE: Multidetector CT imaging of the pelvis was performed using the standard protocol following the bolus administration of intravenous contrast.  CONTRAST:  80mL OMNIPAQUE IOHEXOL 300 MG/ML  SOLN  COMPARISON:  None.  FINDINGS: Note is made of a large right-sided perirectal abscess measuring approximately 4.7 x 3.7 cm. This contains a relatively large amount of air, and underlying gas producing organism is  a concern. Surrounding soft tissue swelling is noted. The abscess extends about the right side of the anorectal canal.  Trace fluid is noted within the pelvic cul-de-sac. This is likely physiologic in nature. An intrauterine device is noted in expected position at the fundus of the uterus. The bladder is mildly distended and grossly unremarkable. The ovaries are relatively symmetric. Visualized small and large bowel loops are grossly unremarkable.  No acute osseous abnormalities are seen.  IMPRESSION: Large right-sided perirectal abscess measures approximately 4.7 x 3.7 cm, containing a relatively large amount of air. An underlying gas-producing organism is a concern. Surrounding soft tissue swelling noted. The  abscess extends about the right side of the anorectal canal.  These results were called by telephone at the time of interpretation on 02/18/2015 at 4:13 am to [facility] PA, who verbally acknowledged these results.   Electronically Signed   By: Roanna Raider M.D.   On: 02/18/2015 04:13    Review of Systems  Constitutional: Positive for fever. Negative for chills and diaphoresis.  HENT: Negative.   Eyes: Negative.   Respiratory: Negative.   Cardiovascular: Negative.   Gastrointestinal:       Rectal drainage  Genitourinary: Negative.   Musculoskeletal: Negative.   Skin: Negative.   Neurological: Negative.   Endo/Heme/Allergies: Negative.   Psychiatric/Behavioral: Negative.     Blood pressure 129/78, pulse 84, temperature 98 F (36.7 C), temperature source Oral, resp. rate 18, last menstrual period 01/28/2015, SpO2 99 %. Physical Exam  Constitutional: She is oriented to person, place, and time. She appears well-developed and well-nourished. No distress.  HENT:  Head: Normocephalic and atraumatic.  Right Ear: External ear normal.  Left Ear: External ear normal.  Eyes: Conjunctivae are normal. Pupils are equal, round, and reactive to light. No scleral icterus.  Neck: Normal range of motion. Neck supple. No thyromegaly present.  Cardiovascular: Normal rate, regular rhythm and normal heart sounds.   No murmur heard. Respiratory: Effort normal and breath sounds normal. No respiratory distress.  GI: Soft. Bowel sounds are normal. She exhibits no distension. There is no tenderness.  Genitourinary:  Right medial buttock with firm, indurated, tender region approx 5 cm in diameter, mild erythema  Musculoskeletal: Normal range of motion. She exhibits no edema.  Neurological: She is alert and oriented to person, place, and time.  Skin: Skin is warm and dry.  Psychiatric: She has a normal mood and affect. Her behavior is normal.     Assessment/Plan Perirectal abscess  Admit to general  surgery service  Begin IV Unasyn  CBC now  Plan OR later this AM for incision and drainage  The risks and benefits of the procedure have been discussed at length with the patient.  The patient understands the proposed procedure, potential alternative treatments, and the course of recovery to be expected.  All of the patient's questions have been answered at this time.  The patient wishes to proceed with surgery.  Velora Heckler, MD, [facility] Office: [phone]    Romar Woodrick Judie Petit 02/18/2015, 5:13 AM

## 2015-02-18 NOTE — Transfer of Care (Signed)
Immediate Anesthesia Transfer of Care Note  Patient: Brenda Flowers  Procedure(s) Performed: Procedure(s): IRRIGATION AND DEBRIDEMENT PERIRECTAL ABSCESS, PLACEMENT OF A  SETON (N/A)  Patient Location: PACU  Anesthesia Type:General  Level of Consciousness: awake, alert , oriented and patient cooperative  Airway & Oxygen Therapy: Patient Spontanous Breathing and Patient connected to face mask oxygen  Post-op Assessment: Report given to RN, Post -op Vital signs reviewed and stable and Patient moving all extremities  Post vital signs: Reviewed and stable  Last Vitals:  Filed Vitals:   02/18/15 0710  BP: 104/53  Pulse: 68  Temp: 36.9 C  Resp: 18    Complications: No apparent anesthesia complications

## 2015-02-18 NOTE — Op Note (Signed)
Operative Note  Brenda Flowers female 44 y.o. 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         Surgeon: Adolph Pollack   Assistants: none  Anesthesia: General LMA anesthesia  Indications:   This is a 44 year old female who developed perianal swelling about 10 days ago. It is progressively worsened. She presented to the emergency department last night with clinical and CT evidence of a large right perirectal abscess.  She was admitted, placed on IV and buttocks, and is now brought to the operating room.    Procedure Detail:  She was brought to the operating room placed supine on the operating table and a general anesthetic was given. She was placed in the lithotomy position. The perianal area was sterilely prepped and draped. A timeout was performed.  A fluctuant indurated area was noted in the right perianal skin. When I pressed on this area fluid drained from the anus. Anoscopy was performed and a large, 2 cm, defect was noted in the right anorectal wall.  I could easily put my finger into this and it tracked up to the skin. This was consistent with a large perirectal abscess with a impending fistula. A small cruciate incision was made over the thin skin. The abscess was completely drained. The cavity was at least 2-3 cm in diameter. A quarter-inch Penrose drain was placed and then sewn together externally as a seton. The area was then anesthetized with half percent Marcaine with epinephrine. A bulky dressing was applied.  She tolerated the procedure well without any apparent complications and was taken to the recovery room in satisfactory condition.        Complications:  * No complications entered in OR log *         Disposition: PACU - hemodynamically stable.         Condition: stable

## 2015-02-18 NOTE — Progress Notes (Signed)
Blood and Surgery Consent signed by pt, nurse, and physician who was at bedside. Pt has no further questions. OR contacted nurse and will be sending for transport soon.

## 2015-02-18 NOTE — Progress Notes (Signed)
Received pt from PACU. Pt in NAD. Pt denies any pain. Report given by Britta Mccreedy from PACU at bedside. Pt alert and oriented.

## 2015-02-18 NOTE — Anesthesia Postprocedure Evaluation (Signed)
  Anesthesia Post-op Note  Patient: Brenda Flowers  Procedure(s) Performed: Procedure(s) (LRB): IRRIGATION AND DEBRIDEMENT PERIRECTAL ABSCESS, PLACEMENT OF A  SETON (N/A)  Patient Location: PACU  Anesthesia Type: General  Level of Consciousness: awake and alert   Airway and Oxygen Therapy: Patient Spontanous Breathing  Post-op Pain: mild  Post-op Assessment: Post-op Vital signs reviewed, Patient's Cardiovascular Status Stable, Respiratory Function Stable, Patent Airway and No signs of Nausea or vomiting  Last Vitals:  Filed Vitals:   02/18/15 1328  BP: 110/54  Pulse: 56  Temp: 37.1 C  Resp: 13    Post-op Vital Signs: stable   Complications: No apparent anesthesia complications

## 2015-02-18 NOTE — ED Provider Notes (Signed)
CSN: [id number]     Arrival date & time 02/18/15  0139 History   First MD Initiated Contact with Patient 02/18/15 [phone]     Chief Complaint  Patient presents with  . Rectal Pain     (Consider location/radiation/quality/duration/timing/severity/associated sxs/prior Treatment) HPI Comments: Presents to the emergency department for evaluation of increasing and severe rectal pain. She has a history of hemorrhoids, most recently treated in early May, approximately 6 weeks ago, by Urgent Care (Cipro, Nitro ointment, Anusol-HC 2.5%) and then by her primary MD Ernie Hew) with lidocaine topical and continuation of Anusol-HC. Symptoms improved. Over the last several days she has had increased recurrent rectal and anal pain, increased pain with bowel movements despite stool softener use, and also reports an anal discharge. She describes her pain as along the right medial buttock. No redness. No fever. No urinary symptoms.  The history is provided by the patient. No language interpreter was used.    History reviewed. No pertinent past medical history. History reviewed. No pertinent past surgical history. Family History  Problem Relation Age of Onset  . Cancer Father   . Hyperlipidemia Father   . Cancer Maternal Grandmother   . Cancer Maternal Grandfather    History  Substance Use Topics  . Smoking status: Never Smoker   . Smokeless tobacco: Not on file  . Alcohol Use: 0.0 oz/week    0 Standard drinks or equivalent per week   OB History    No data available     Review of Systems  Constitutional: Negative for fever and chills.  Gastrointestinal: Positive for rectal pain. Negative for nausea and abdominal pain.  Musculoskeletal: Negative.  Negative for myalgias.  Skin: Negative.  Negative for color change.  Neurological: Negative.       Allergies  Hydrocodone-acetaminophen  Home Medications   Prior to Admission medications   Medication Sig Start Date End Date Taking? Authorizing  Provider  acetaminophen (TYLENOL) 500 MG tablet Take 1,000 mg by mouth every 6 (six) hours as needed for moderate pain.   Yes Historical Provider, MD  ibuprofen (ADVIL,MOTRIN) 200 MG tablet Take 800 mg by mouth every 6 (six) hours as needed for moderate pain.   Yes Historical Provider, MD  levonorgestrel (MIRENA) 20 MCG/24HR IUD 1 each by Intrauterine route once.    Yes Historical Provider, MD  Lidocaine-Hydrocortisone Ace 3-0.5 % KIT Place 1 application rectally 3 (three) times daily as needed (pain).  02/08/15  Yes Historical Provider, MD  methylphenidate 54 MG PO CR tablet Take 54 mg by mouth daily.  11/08/14 11/08/15 Yes Historical Provider, MD  ciprofloxacin (CIPRO) 500 MG tablet Take 1 tablet (500 mg total) by mouth 2 (two) times daily. Patient not taking: Reported on 02/18/2015 12/24/14   Araceli Bouche, PA  HYDROcodone-acetaminophen (NORCO) 5-325 MG per tablet Take 1 tablet by mouth every 6 (six) hours as needed. Patient not taking: Reported on 02/18/2015 12/24/14   Araceli Bouche, PA  hydrocortisone (ANUSOL-HC) 2.5 % rectal cream Place 1 application rectally 2 (two) times daily. Patient not taking: Reported on 02/18/2015 12/24/14   Araceli Bouche, PA  meloxicam (MOBIC) 15 MG tablet Take 1 tablet (15 mg total) by mouth daily as needed for pain. Patient not taking: Reported on 02/18/2015 12/20/14   Gerda Diss, DO  nitroGLYCERIN (NITRODUR - DOSED IN MG/24 HR) 0.2 mg/hr patch Use 1/4 patch daily to the affected area Patient not taking: Reported on 02/18/2015 02/05/15   Stefanie Libel, MD  Nitroglycerin 0.4 % OINT Apply pea-sized  amount twice daily. Patient not taking: Reported on 02/18/2015 12/24/14   Todd McVeigh, PA   BP 131/80 mmHg  Pulse 87  Temp(Src) 97.9 F (36.6 C) (Oral)  Resp 16  SpO2 100%  LMP 01/28/2015 Physical Exam  Constitutional: She is oriented to person, place, and time. She appears well-developed and well-nourished.  Neck: Normal range of motion.  Pulmonary/Chest: Effort normal.   Genitourinary:  There is a large induration of right perirectal buttock without fluctuance. Significantly tender to touch. No visualized external hemorrhoids.   Neurological: She is alert and oriented to person, place, and time.  Skin: Skin is warm and dry.    ED Course  Procedures (including critical care time) Labs Review Labs Reviewed  URINALYSIS, ROUTINE W REFLEX MICROSCOPIC (NOT AT [facility]) - Abnormal; Notable for the following:    APPearance CLOUDY (*)    All other components within normal limits  I-STAT CHEM 8, ED - Abnormal; Notable for the following:    Glucose, Bld 105 (*)    All other components within normal limits  PREGNANCY, URINE   Results for orders placed or performed during the hospital encounter of 02/18/15  Urinalysis, Routine w reflex microscopic (not at [facility])  Result Value Ref Range   Color, Urine YELLOW YELLOW   APPearance CLOUDY (A) CLEAR   Specific Gravity, Urine 1.018 1.005 - 1.030   pH 6.0 5.0 - 8.0   Glucose, UA NEGATIVE NEGATIVE mg/dL   Hgb urine dipstick NEGATIVE NEGATIVE   Bilirubin Urine NEGATIVE NEGATIVE   Ketones, ur NEGATIVE NEGATIVE mg/dL   Protein, ur NEGATIVE NEGATIVE mg/dL   Urobilinogen, UA 0.2 0.0 - 1.0 mg/dL   Nitrite NEGATIVE NEGATIVE   Leukocytes, UA NEGATIVE NEGATIVE  Pregnancy, urine  Result Value Ref Range   Preg Test, Ur NEGATIVE NEGATIVE  I-stat Chem 8, ED  Result Value Ref Range   Sodium 138 135 - 145 mmol/L   Potassium 3.5 3.5 - 5.1 mmol/L   Chloride 105 101 - 111 mmol/L   BUN 9 6 - 20 mg/dL   Creatinine, Ser 0.70 0.44 - 1.00 mg/dL   Glucose, Bld 105 (H) 65 - 99 mg/dL   Calcium, Ion 1.14 1.12 - 1.23 mmol/L   TCO2 20 0 - 100 mmol/L   Hemoglobin 12.6 12.0 - 15.0 g/dL   HCT 37.0 36.0 - 46.0 %   Ct Pelvis W Contrast  02/18/2015   CLINICAL DATA:  Rectal pain. Assess for perirectal abscess. Initial encounter.  EXAM: CT PELVIS WITH CONTRAST  TECHNIQUE: Multidetector CT imaging of the pelvis was performed using the standard  protocol following the bolus administration of intravenous contrast.  CONTRAST:  76m OMNIPAQUE IOHEXOL 300 MG/ML  SOLN  COMPARISON:  None.  FINDINGS: Note is made of a large right-sided perirectal abscess measuring approximately 4.7 x 3.7 cm. This contains a relatively large amount of air, and underlying gas producing organism is a concern. Surrounding soft tissue swelling is noted. The abscess extends about the right side of the anorectal canal.  Trace fluid is noted within the pelvic cul-de-sac. This is likely physiologic in nature. An intrauterine device is noted in expected position at the fundus of the uterus. The bladder is mildly distended and grossly unremarkable. The ovaries are relatively symmetric. Visualized small and large bowel loops are grossly unremarkable.  No acute osseous abnormalities are seen.  IMPRESSION: Large right-sided perirectal abscess measures approximately 4.7 x 3.7 cm, containing a relatively large amount of air. An underlying gas-producing organism is a concern.  Surrounding soft tissue swelling noted. The abscess extends about the right side of the anorectal canal.  These results were called by telephone at the time of interpretation on 02/18/2015 at 4:13 am to [place] PA, who verbally acknowledged these results.   Electronically Signed   By: Garald Balding M.D.   On: 02/18/2015 04:13    Imaging Review No results found.   EKG Interpretation None      MDM   Final diagnoses:  None    1. Perirectal abscess  Discussed with general surgery (Dr. Harlow Asa) who will see and admit the patient. Re-evalaution: the patient is comfortable with pain medications provided.     Charlann Lange, PA-C 02/18/15 0938  Serita Grit, MD 02/18/15 [phone]

## 2015-02-19 ENCOUNTER — Encounter (HOSPITAL_COMMUNITY): Payer: Self-pay | Admitting: General Surgery

## 2015-02-19 MED ORDER — ONDANSETRON HCL 4 MG PO TABS: 4.0000 mg | ORAL_TABLET | Freq: Three times a day (TID) | ORAL | Status: DC | PRN

## 2015-02-19 MED ORDER — SACCHAROMYCES BOULARDII 250 MG PO CAPS: 250.0000 mg | ORAL_CAPSULE | Freq: Two times a day (BID) | ORAL | Status: DC

## 2015-02-19 MED ORDER — METRONIDAZOLE 500 MG PO TABS: 500.0000 mg | ORAL_TABLET | Freq: Three times a day (TID) | ORAL | Status: DC

## 2015-02-19 MED ORDER — OXYCODONE-ACETAMINOPHEN 5-325 MG PO TABS: 1.0000 | ORAL_TABLET | ORAL | Status: DC | PRN

## 2015-02-19 MED ORDER — AMOXICILLIN-POT CLAVULANATE 875-125 MG PO TABS: 1.0000 | ORAL_TABLET | Freq: Two times a day (BID) | ORAL | Status: DC

## 2015-02-19 NOTE — Discharge Instructions (Signed)
Sitz Bath °A sitz bath is a warm water bath taken in the sitting position that covers only the hips and buttocks. It may be used for either healing or hygiene purposes. Sitz baths are also used to relieve pain, itching, or muscle spasms. The water may contain medicine. Moist heat will help you heal and relax.  °HOME CARE INSTRUCTIONS  °Take 3 to 4 sitz baths a day. °1. Fill the bathtub half full with warm water. °2. Sit in the water and open the drain a little. °3. Turn on the warm water to keep the tub half full. Keep the water running constantly. °4. Soak in the water for 15 to 20 minutes. °5. After the sitz bath, pat the affected area dry first. °SEEK MEDICAL CARE IF:  °You get worse instead of better. Stop the sitz baths if you get worse. °MAKE SURE YOU: °· Understand these instructions. °· Will watch your condition. °· Will get help right away if you are not doing well or get worse. °Document Released: 05/09/2004 Document Revised: 05/11/2012 Document Reviewed: 11/14/2010 °ExitCare® Patient Information ©2015 [employer]. This information is not intended to replace advice given to you by your health care provider. Make sure you discuss any questions you have with your health care provider. ° °

## 2015-02-19 NOTE — Discharge Summary (Signed)
Patient ID: Brenda Flowers MRN: [id number] DOB/AGE: 1971/03/02 44 y.o.  Admit date: 02/18/2015 Discharge date: 02/19/2015  Procedures: Anoscopy, incision and drainage of right perirectal abscess, placement of seton (1/4 " penrose drain).  Consults: None  Reason for Admission: patient is a 44 yo WF with 2 week history of "hemorrhoids" not responding to creams and stool softeners. Pain worse last two days with development of swelling in right medial buttock. Some drainage from rectum. Normal BM's. Presented to ER for evaluation. CT scan shows approx 5 cm abscess in right perirectal location containing air with surrounding inflammation. General surgery called for management.  Admission Diagnoses:  1. Perirectal abscess  Hospital Course: The patient was admitted and taken to the OR where she underwent the above procedure.  She tolerated this well.  On POD 1, her pain was well controlled.  Her wound was appropriate.  She was stable for dc home on oral augment/flagyl/probiotic.  PE: Buttock: wound present with some drainage noted and penrose in place.  Delphina Cahill is not frankly visible.  Some induration still present as expected.  Discharge Diagnoses:  Active Problems:   Perirectal abscess with fistula   Discharge Medications:   Medication List    ASK your doctor about these medications        acetaminophen 500 MG tablet  Commonly known as:  TYLENOL  Take 1,000 mg by mouth every 6 (six) hours as needed for moderate pain.     ciprofloxacin 500 MG tablet  Commonly known as:  CIPRO  Take 1 tablet (500 mg total) by mouth 2 (two) times daily.     HYDROcodone-acetaminophen 5-325 MG per tablet  Commonly known as:  NORCO  Take 1 tablet by mouth every 6 (six) hours as needed.     hydrocortisone 2.5 % rectal cream  Commonly known as:  ANUSOL-HC  Place 1 application rectally 2 (two) times daily.     ibuprofen 200 MG tablet  Commonly known as:  ADVIL,MOTRIN  Take 800 mg by mouth every 6  (six) hours as needed for moderate pain.     levonorgestrel 20 MCG/24HR IUD  Commonly known as:  MIRENA  1 each by Intrauterine route once.     Lidocaine-Hydrocortisone Ace 3-0.5 % Kit  Place 1 application rectally 3 (three) times daily as needed (pain).     meloxicam 15 MG tablet  Commonly known as:  MOBIC  Take 1 tablet (15 mg total) by mouth daily as needed for pain.     methylphenidate 54 MG CR tablet  Commonly known as:  CONCERTA  Take 54 mg by mouth daily.     nitroGLYCERIN 0.2 mg/hr patch  Commonly known as:  NITRODUR - Dosed in mg/24 hr  Use 1/4 patch daily to the affected area     Nitroglycerin 0.4 % Oint  Apply pea-sized amount twice daily.        Discharge Instructions:     Follow-up Information    Follow up with Odis Hollingshead, MD On 03/18/2015.   Specialty:  General Surgery   Why:  10:00am, arrive by 9:30am for paperwork   Contact information:   [address] [place] [address] [phone]       Signed: Henreitta Cea 02/19/2015, 11:12 AM

## 2015-02-19 NOTE — Progress Notes (Signed)
Discharge instructions given to pt and husband who was at bedside. All questions answered. Prescriptions reviewed and work note given. Pt and family have no further questions. Pt to be wheel downstairs to her car with tech.

## 2015-03-20 ENCOUNTER — Ambulatory Visit: Payer: BLUE CROSS/BLUE SHIELD | Admitting: Sports Medicine

## 2015-07-11 ENCOUNTER — Ambulatory Visit: Payer: Self-pay | Admitting: General Surgery

## 2015-07-12 ENCOUNTER — Encounter (HOSPITAL_BASED_OUTPATIENT_CLINIC_OR_DEPARTMENT_OTHER): Payer: Self-pay | Admitting: *Deleted

## 2015-07-12 NOTE — Progress Notes (Signed)
NPO AFTER MN.  ARRIVE AT 1045.  GETTING LAB WORK DONE Monday 07-15-2015 (CBC, BMET, URINE PREG)

## 2015-07-15 DIAGNOSIS — K603 Anal fistula: Secondary | ICD-10-CM | POA: Diagnosis not present

## 2015-07-15 DIAGNOSIS — J45990 Exercise induced bronchospasm: Secondary | ICD-10-CM | POA: Diagnosis not present

## 2015-07-15 DIAGNOSIS — G709 Myoneural disorder, unspecified: Secondary | ICD-10-CM | POA: Diagnosis not present

## 2015-07-15 DIAGNOSIS — K644 Residual hemorrhoidal skin tags: Secondary | ICD-10-CM | POA: Diagnosis not present

## 2015-07-15 DIAGNOSIS — R339 Retention of urine, unspecified: Secondary | ICD-10-CM | POA: Diagnosis not present

## 2015-07-15 LAB — BASIC METABOLIC PANEL
Anion gap: 7 (ref 5–15)
BUN: 11 mg/dL (ref 6–20)
CHLORIDE: 106 mmol/L (ref 101–111)
CO2: 25 mmol/L (ref 22–32)
Calcium: 9.4 mg/dL (ref 8.9–10.3)
Creatinine, Ser: 0.85 mg/dL (ref 0.44–1.00)
GFR calc non Af Amer: >60 mL/min (ref >60)
Glucose, Bld: 96 mg/dL (ref 65–99)
Potassium: 4.1 mmol/L (ref 3.5–5.1)
Sodium: 138 mmol/L (ref 135–145)

## 2015-07-15 LAB — CBC
HCT: 38.7 % (ref 36.0–46.0)
Hemoglobin: 12.8 g/dL (ref 12.0–15.0)
MCH: 29.9 pg (ref 26.0–34.0)
MCHC: 33.1 g/dL (ref 30.0–36.0)
MCV: 90.4 fL (ref 78.0–100.0)
Platelets: 232 10*3/uL (ref 150–400)
RBC: 4.28 MIL/uL (ref 3.87–5.11)
RDW: 13 % (ref 11.5–15.5)
WBC: 6.1 10*3/uL (ref 4.0–10.5)

## 2015-07-16 ENCOUNTER — Encounter (HOSPITAL_BASED_OUTPATIENT_CLINIC_OR_DEPARTMENT_OTHER): Payer: Self-pay | Admitting: *Deleted

## 2015-07-16 ENCOUNTER — Ambulatory Visit (HOSPITAL_BASED_OUTPATIENT_CLINIC_OR_DEPARTMENT_OTHER): Payer: BLUE CROSS/BLUE SHIELD | Admitting: Anesthesiology

## 2015-07-16 ENCOUNTER — Ambulatory Visit (HOSPITAL_BASED_OUTPATIENT_CLINIC_OR_DEPARTMENT_OTHER)
Admission: RE | Admit: 2015-07-16 | Discharge: 2015-07-16 | Disposition: A | Discharge status: Home / Self Care | Payer: BLUE CROSS/BLUE SHIELD | Source: Ambulatory Visit | Attending: General Surgery | Admitting: General Surgery

## 2015-07-16 ENCOUNTER — Encounter (HOSPITAL_BASED_OUTPATIENT_CLINIC_OR_DEPARTMENT_OTHER)
Admission: RE | Disposition: A | Discharge status: Home / Self Care | Payer: Self-pay | Source: Ambulatory Visit | Attending: General Surgery

## 2015-07-16 DIAGNOSIS — K644 Residual hemorrhoidal skin tags: Secondary | ICD-10-CM | POA: Insufficient documentation

## 2015-07-16 DIAGNOSIS — K611 Rectal abscess: Secondary | ICD-10-CM

## 2015-07-16 DIAGNOSIS — R339 Retention of urine, unspecified: Secondary | ICD-10-CM | POA: Insufficient documentation

## 2015-07-16 DIAGNOSIS — J45990 Exercise induced bronchospasm: Secondary | ICD-10-CM | POA: Insufficient documentation

## 2015-07-16 DIAGNOSIS — G709 Myoneural disorder, unspecified: Secondary | ICD-10-CM | POA: Insufficient documentation

## 2015-07-16 DIAGNOSIS — K603 Anal fistula: Secondary | ICD-10-CM | POA: Insufficient documentation

## 2015-07-16 HISTORY — PX: ANAL FISTULOTOMY: SHX6423

## 2015-07-16 HISTORY — DX: Family history of other specified conditions: Z84.89

## 2015-07-16 HISTORY — DX: Anal fistula: K60.3

## 2015-07-16 HISTORY — DX: Anal fistula, unspecified: K60.30

## 2015-07-16 LAB — POCT PREGNANCY, URINE: Preg Test, Ur: NEGATIVE

## 2015-07-16 SURGERY — ANAL FISTULOTOMY
Anesthesia: General | Site: Anus

## 2015-07-16 MED ORDER — DEXAMETHASONE SODIUM PHOSPHATE 4 MG/ML IJ SOLN
INTRAMUSCULAR | Status: DC | PRN
  Administered 2015-07-16: 10 mg via INTRAVENOUS

## 2015-07-16 MED ORDER — OXYCODONE HCL 5 MG PO TABS: 5.0000 mg | ORAL_TABLET | ORAL | Status: DC | PRN

## 2015-07-16 MED ORDER — MIDAZOLAM HCL 2 MG/2ML IJ SOLN
INTRAMUSCULAR | Status: AC
  Filled 2015-07-16: qty 4

## 2015-07-16 MED ORDER — KETOROLAC TROMETHAMINE 30 MG/ML IJ SOLN
INTRAMUSCULAR | Status: AC
  Filled 2015-07-16: qty 1

## 2015-07-16 MED ORDER — FENTANYL CITRATE (PF) 100 MCG/2ML IJ SOLN
INTRAMUSCULAR | Status: DC | PRN
  Administered 2015-07-16: 50 ug via INTRAVENOUS

## 2015-07-16 MED ORDER — PROPOFOL 10 MG/ML IV BOLUS
INTRAVENOUS | Status: AC
  Filled 2015-07-16: qty 20

## 2015-07-16 MED ORDER — PROPOFOL 10 MG/ML IV BOLUS
INTRAVENOUS | Status: DC | PRN
  Administered 2015-07-16: 200 mg via INTRAVENOUS

## 2015-07-16 MED ORDER — GLYCOPYRROLATE 0.2 MG/ML IJ SOLN
INTRAMUSCULAR | Status: AC
  Filled 2015-07-16: qty 1

## 2015-07-16 MED ORDER — MIDAZOLAM HCL 5 MG/5ML IJ SOLN
INTRAMUSCULAR | Status: DC | PRN
  Administered 2015-07-16: 2 mg via INTRAVENOUS

## 2015-07-16 MED ORDER — BUPIVACAINE LIPOSOME 1.3 % IJ SUSP
INTRAMUSCULAR | Status: AC
  Filled 2015-07-16: qty 20

## 2015-07-16 MED ORDER — LACTATED RINGERS IV SOLN
INTRAVENOUS | Status: DC
  Filled 2015-07-16: qty 1000

## 2015-07-16 MED ORDER — LACTATED RINGERS IV SOLN
INTRAVENOUS | Status: DC
  Administered 2015-07-16: 12:00:00 via INTRAVENOUS
  Filled 2015-07-16: qty 1000

## 2015-07-16 MED ORDER — BUPIVACAINE LIPOSOME 1.3 % IJ SUSP
INTRAMUSCULAR | Status: DC | PRN
  Administered 2015-07-16: 20 mL

## 2015-07-16 MED ORDER — FENTANYL CITRATE (PF) 100 MCG/2ML IJ SOLN
25.0000 ug | INTRAMUSCULAR | Status: DC | PRN
  Filled 2015-07-16: qty 1

## 2015-07-16 MED ORDER — CEFOTETAN DISODIUM 2 G IJ SOLR
2.0000 g | INTRAMUSCULAR | Status: AC
  Administered 2015-07-16: 2 g via INTRAVENOUS
  Filled 2015-07-16: qty 2

## 2015-07-16 MED ORDER — ONDANSETRON HCL 4 MG PO TABS: 4.0000 mg | ORAL_TABLET | Freq: Three times a day (TID) | ORAL | Status: AC | PRN

## 2015-07-16 MED ORDER — OXYCODONE HCL 5 MG PO TABS
5.0000 mg | ORAL_TABLET | ORAL | Status: DC | PRN
  Filled 2015-07-16: qty 2

## 2015-07-16 MED ORDER — LIDOCAINE HCL (CARDIAC) 20 MG/ML IV SOLN
INTRAVENOUS | Status: AC
  Filled 2015-07-16: qty 5

## 2015-07-16 MED ORDER — LIDOCAINE HCL (CARDIAC) 20 MG/ML IV SOLN
INTRAVENOUS | Status: DC | PRN
  Administered 2015-07-16: 80 mg via INTRAVENOUS

## 2015-07-16 MED ORDER — SODIUM CHLORIDE 0.9 % IJ SOLN
3.0000 mL | INTRAMUSCULAR | Status: DC | PRN
  Filled 2015-07-16: qty 3

## 2015-07-16 MED ORDER — ONDANSETRON HCL 4 MG/2ML IJ SOLN
INTRAMUSCULAR | Status: AC
  Filled 2015-07-16: qty 2

## 2015-07-16 MED ORDER — DEXAMETHASONE SODIUM PHOSPHATE 10 MG/ML IJ SOLN
INTRAMUSCULAR | Status: AC
  Filled 2015-07-16: qty 1

## 2015-07-16 MED ORDER — ONDANSETRON HCL 4 MG/2ML IJ SOLN
INTRAMUSCULAR | Status: DC | PRN
  Administered 2015-07-16: 4 mg via INTRAVENOUS

## 2015-07-16 MED ORDER — FENTANYL CITRATE (PF) 100 MCG/2ML IJ SOLN
INTRAMUSCULAR | Status: AC
  Filled 2015-07-16: qty 4

## 2015-07-16 MED ORDER — CEFOTETAN DISODIUM-DEXTROSE 2-2.08 GM-% IV SOLR
INTRAVENOUS | Status: AC
  Filled 2015-07-16: qty 50

## 2015-07-16 SURGICAL SUPPLY — 36 items
BRIEF STRETCH FOR OB PAD LRG (UNDERPADS AND DIAPERS) ×3 IMPLANT
CANISTER SUCTION 2500CC (MISCELLANEOUS) ×3 IMPLANT
COVER BACK TABLE 60X90IN (DRAPES) ×3 IMPLANT
DRAPE LG THREE QUARTER DISP (DRAPES) ×3 IMPLANT
DRAPE UNDERBUTTOCKS STRL (DRAPE) ×3 IMPLANT
DRSG PAD ABDOMINAL 8X10 ST (GAUZE/BANDAGES/DRESSINGS) ×3 IMPLANT
ELECT REM PT RETURN 9FT ADLT (ELECTROSURGICAL) ×3
ELECTRODE REM PT RTRN 9FT ADLT (ELECTROSURGICAL) ×1 IMPLANT
GLOVE BIO SURGEON STRL SZ 6.5 (GLOVE) ×2 IMPLANT
GLOVE BIO SURGEON STRL SZ8 (GLOVE) ×3 IMPLANT
GLOVE BIO SURGEONS STRL SZ 6.5 (GLOVE) ×1
GLOVE BIOGEL PI IND STRL 6.5 (GLOVE) ×1 IMPLANT
GLOVE BIOGEL PI IND STRL 7.5 (GLOVE) ×2 IMPLANT
GLOVE BIOGEL PI IND STRL 8 (GLOVE) ×1 IMPLANT
GLOVE BIOGEL PI INDICATOR 6.5 (GLOVE) ×2
GLOVE BIOGEL PI INDICATOR 7.5 (GLOVE) ×4
GLOVE BIOGEL PI INDICATOR 8 (GLOVE) ×2
GOWN STRL REUS W/ TWL LRG LVL3 (GOWN DISPOSABLE) ×2 IMPLANT
GOWN STRL REUS W/TWL LRG LVL3 (GOWN DISPOSABLE) ×4
KIT ROOM TURNOVER WOR (KITS) ×3 IMPLANT
LEGGING LITHOTOMY PAIR STRL (DRAPES) ×3 IMPLANT
NEEDLE HYPO 22GX1.5 SAFETY (NEEDLE) ×3 IMPLANT
PACK BASIN DAY SURGERY FS (CUSTOM PROCEDURE TRAY) ×3 IMPLANT
PAD PREP 24X48 CUFFED NSTRL (MISCELLANEOUS) ×3 IMPLANT
PENCIL BUTTON HOLSTER BLD 10FT (ELECTRODE) ×3 IMPLANT
SPONGE HEMORRHOID 8X3CM (HEMOSTASIS) ×3 IMPLANT
SURGILUBE 2OZ TUBE FLIPTOP (MISCELLANEOUS) ×3 IMPLANT
SYR CONTROL 10ML LL (SYRINGE) ×3 IMPLANT
TOWEL OR 17X24 6PK STRL BLUE (TOWEL DISPOSABLE) ×6 IMPLANT
TRAY DSU PREP LF (CUSTOM PROCEDURE TRAY) ×3 IMPLANT
TRAY FOLEY CATH SILVER 14FR (SET/KITS/TRAYS/PACK) ×3 IMPLANT
TUBE CONNECTING 12'X1/4 (SUCTIONS) ×1
TUBE CONNECTING 12X1/4 (SUCTIONS) ×2 IMPLANT
UNDERPAD 30X30 INCONTINENT (UNDERPADS AND DIAPERS) ×3 IMPLANT
WATER STERILE IRR 500ML POUR (IV SOLUTION) ×3 IMPLANT
YANKAUER SUCT BULB TIP NO VENT (SUCTIONS) ×3 IMPLANT

## 2015-07-16 NOTE — Discharge Instructions (Addendum)
[facility] _______[facility]  RECTAL SURGERY POST OP INSTRUCTIONS: POST OP INSTRUCTIONS  Always review your discharge instruction sheet given to you by the facility where your surgery was performed. IF YOU HAVE DISABILITY OR FAMILY LEAVE FORMS, YOU MUST BRING THEM TO THE OFFICE FOR PROCESSING.   DO NOT GIVE THEM TO YOUR DOCTOR.  1. A  prescription for pain medication may be given to you upon discharge.  Take your pain medication as prescribed, if needed.  If narcotic pain medicine is not needed, then you may take acetaminophen (Tylenol) or ibuprofen (Advil) as needed. 2. Take your usually prescribed medications unless otherwise directed. 3. If you need a refill on your pain medication, please contact your pharmacy.  They will contact our office to request authorization. Prescriptions will not be filled after 5 pm or on week-ends. 4. You should follow a light diet the first 48 hours after arrival home, such as soup and crackers, etc.  Be sure to include lots of fluids daily.  Resume your normal diet 2-3 days after surgery.. 5. Most patients will experience some swelling and discomfort in the rectal area. Ice packs, reclining and warm tub soaks will help.  Swelling and discomfort can take several days to resolve.  6. It is common to experience some constipation if taking pain medication after surgery.  Increasing fluid intake and taking a stool softener (such as Colace) will usually help or prevent this problem from occurring.  A mild laxative (Milk of Magnesia or Miralax) should be taken according to package directions if there are no bowel movements after 48 hours. 7. Unless discharge instructions indicate otherwise, leave your bandage dry and in place for 24 hours, or remove the bandage if you have a bowel movement. You may notice a small amount of bleeding with bowel movements for the first few days. You may have some packing in the rectum which will come out over the first day or two.  You will need to wear an absorbent pad or soft cotton gauze in your underwear until the drainage stops.it. 8. ACTIVITIES:  You may resume regular (light) daily activities beginning the next day--such as daily self-care, walking, climbing stairs--gradually increasing activities as tolerated.  You may have sexual intercourse when it is comfortable.  Refrain from any heavy lifting, activity or straining until you are pain-free. a. You may drive when you are no longer taking prescription pain medication, you can comfortably wear a seatbelt, and you can safely maneuver your car and apply brakes. b. RETURN TO WORK: : Light work in 3-7 days, full duty when pain-free.____________________ c.  9. You should see your doctor in the office for a follow-up appointment approximately 2-3 weeks after your surgery.  Make sure that you call for this appointment within a day or two after you arrive home to insure a convenient appointment time. 10. OTHER INSTRUCTIONS:  ___Remove foley catheter tomorrow morning.  Call if unable to void within 6 hours of catheter removal._______________________________________________________________________________________________________________________________________________________________________________________  WHEN TO CALL YOUR DOCTOR: 1. Fever over 101.0 2. Inability to urinate 3. Nausea and/or vomiting 4. Extreme swelling or bruising 5. Continued bleeding from rectum. 6. Increased pain, redness, or drainage from the incision 7. Constipation  The clinic staff is available to answer your questions during regular business hours.  Please dont hesitate to call and ask to speak to one of the nurses for clinical concerns.  If you have a medical emergency, go to the nearest emergency room or call 911.  A  surgeon from [facility] is always on call at the hospital   [address], [address], [place], [place]  [address] ?  [address] [address], [place], [place]    [id number] [phone] ? [phone] ? FAX [phone] Web site: [email or web address] Information for Discharge Teaching: EXPAREL (bupivacaine liposome injectable suspension)   Your surgeon gave you EXPAREL(bupivacaine) in your surgical incision to help control your pain after surgery.   EXPAREL is a local anesthetic that provides pain relief by numbing the tissue around the surgical site.  EXPAREL is designed to release pain medication over time and can control pain for up to 72 hours.  Depending on how you respond to EXPAREL, you may require less pain medication during your recovery.  Possible side effects:  Temporary loss of sensation or ability to move in the area where bupivacaine was injected.  Nausea, vomiting, constipation  Rarely, numbness and tingling in your mouth or lips, lightheadedness, or anxiety may occur.  Call your doctor right away if you think you may be experiencing any of these sensations, or if you have other questions regarding possible side effects.  Follow all other discharge instructions given to you by your surgeon or nurse. Eat a healthy diet and drink plenty of water or other fluids.  If you return to the hospital for any reason within 96 hours following the administration of EXPAREL, please inform your health care providers.

## 2015-07-16 NOTE — Op Note (Signed)
Operative Note  Brenda GarbeCherie N Flowers female 44 y.o. 07/16/2015  PREOPERATIVE DX:  Anal fistula  POSTOPERATIVE DX:  Same with anterior skin tag  PROCEDURE:   Anal fistulotomy and removal of anterior skin tag         Surgeon: Adolph PollackOSENBOWER,Brenda Flowers   Assistants: none  Anesthesia: General LMA anesthesia  Indications:   This is a 44 year old female who very large complex right perirectal abscess that was drained in June. At that time she is also noted to have a fistula. A seton was placed. The fistula track has slowly become more shallow over time and now she presents for fistulotomy. The procedure and risks were discussed with her preoperatively.    Procedure Detail:  She was seen in the holding room and brought to the operating room, placed supine on the operating, a general anesthetic was given. She was placed in lithotomy position. The perianal area was sterilely prepped and draped. A timeout was performed.  An anal block was performed with Exparel. The seton was evident and in place on the right side. Anoscopy was performed and the fistula tract noted. A fistula probe was passed from external to internal. The seton was cut and removed. Using electrocautery, a fistulotomy was performed through the skin and subcutaneous tissue in the thin band of external sphincter muscle. Internal sphincter was left intact. The track was debrided and then cauterized to control bleeding.  A anterior skin tag was prolapsing into the wound. The anterior skin tag was removed and sent to pathology.  Once hemostasis was adequate, Gelfoam was placed into the wound followed by a dressing. Because she had acute urinary retention requiring a Foley with her procedure in June, a Foley was placed and she will be sent home with this attached to a leg bag. She will be instructed to remove it in the morning.  She tolerated the procedure well without any apparent complications.  She was taken to the recovery room in satisfactory  condition.  Complications:  * No complications entered in OR log *         Disposition: PACU - hemodynamically stable.         Condition: stable

## 2015-07-16 NOTE — H&P (Signed)
Brenda Flowers 06/26/2015 9:08 AM Location: [facility] Patient #: [id number] DOB: 05-Nov-1970 Married / Language: English / Race: White Female   History of Present Illness Adolph Pollack(Jozalyn Baglio J. Gertie Broerman MD; 06/26/2015 9:23 AM) The patient is a 44 year old female.  Note:She presents for another follow-up visit. 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 Burnadette PopSeton is in place. She has less pain in general.  Allergies Fay Records(Ashley Beck, CMA; 06/26/2015 9:08 AM) Hydrocodone-Acetaminophen *ANALGESICS - OPIOID*  Medication History Fay Records(Ashley Beck, CMA; 06/26/2015 9:09 AM) Debroah BallerFlorastor (250MG  Capsule, Oral) Active. Advil (100MG  Tablet Chewable, Oral) Active. Medications Reconciled  Vitals Fay Records(Ashley Beck CMA; 06/26/2015 9:09 AM) 06/26/2015 9:09 AM Weight: 155 lb Height: 68in Body Surface Area: 1.83 m Body Mass Index: 23.57 kg/m  Temp.: 98.57F(Temporal)  Pulse: 68 (Regular)  BP: 128/62 (Sitting, Left Arm, Standard)       Physical Exam Adolph Pollack(Dragan Tamburrino J. Kiyana Vazguez MD; 06/26/2015 9:24 AM) The physical exam findings are as follows: Note:General: WDWN in NAD. Pleasant and cooperative.  CV: RRR, no murmur, no JVD.  CHEST: Breath sounds equal and clear. Respirations nonlabored.  ANORECTAL: Seton in place, fistula track appears to be more shallow now.  NEUROLOGIC: Alert and oriented, answers questions appropriately.  PSYCHIATRIC: Normal mood, affect , and behavior.    Assessment & Plan Adolph Pollack(Eboney Claybrook J. Sailor Hevia MD; 06/26/2015 9:26 AM) ANAL FISTULA (K60.3) Impression: I believe she is ready for fistulotomy.  Plan: Outpatient fistulotomy. She has a history of urinary retention with previous pelvic procedures so will have her go home with a Foley overnight and remove it in the morning. We have discussed the procedure and the risks. Risks include but are not limited to bleeding, infection, wound healing prongs, anesthesia, potential incontinence problems later in life. We  talked about getting pelvic floor physical therapy consultation after she is completely healed from the surgery. She seemed to understand all this and would like to proceed.   Avel Peaceodd Jana Swartzlander, MD

## 2015-07-16 NOTE — Anesthesia Preprocedure Evaluation (Signed)
Anesthesia Evaluation  Patient identified by MRN, date of birth, ID band Patient awake    Reviewed: Allergy & Precautions, H&P , NPO status , Patient's Chart, lab work & pertinent test results  Airway Mallampati: II  TM Distance: >3 FB Neck ROM: Full    Dental no notable dental hx. (+) Dental Advisory Given, Teeth Intact   Pulmonary asthma ,  exersize induced asthma   Pulmonary exam normal breath sounds clear to auscultation       Cardiovascular Exercise Tolerance: Good negative cardio ROS Normal cardiovascular exam Rhythm:Regular Rate:Normal     Neuro/Psych PSYCHIATRIC DISORDERS  Neuromuscular disease negative neurological ROS  negative psych ROS   GI/Hepatic negative GI ROS, Neg liver ROS,   Endo/Other  negative endocrine ROS  Renal/GU negative Renal ROS  negative genitourinary   Musculoskeletal negative musculoskeletal ROS (+)   Abdominal   Peds negative pediatric ROS (+)  Hematology negative hematology ROS (+)   Anesthesia Other Findings   Reproductive/Obstetrics negative OB ROS                             Anesthesia Physical Anesthesia Plan  ASA: II  Anesthesia Plan: General   Post-op Pain Management:    Induction: Intravenous  Airway Management Planned: Oral ETT  Additional Equipment:   Intra-op Plan:   Post-operative Plan: Extubation in OR  Informed Consent: I have reviewed the patients History and Physical, chart, labs and discussed the procedure including the risks, benefits and alternatives for the proposed anesthesia with the patient or authorized representative who has indicated his/her understanding and acceptance.   Dental Advisory Given  Plan Discussed with: CRNA and Surgeon  Anesthesia Plan Comments:         Anesthesia Quick Evaluation

## 2015-07-16 NOTE — Interval H&P Note (Signed)
History and Physical Interval Note:  07/16/2015 12:35 PM  Brenda Flowers  has presented today for surgery, with the diagnosis of fistula in ano  The various methods of treatment have been discussed with the patient and family. After consideration of risks, benefits and other options for treatment, the patient has consented to  Procedure(s): ANAL FISTULOTOMY (N/A) as a surgical intervention .  The patient's history has been reviewed, patient examined, no change in status, stable for surgery.  I have reviewed the patient's chart and labs.  Questions were answered to the patient's satisfaction.     Lorain Fettes JShela Commons

## 2015-07-16 NOTE — Anesthesia Postprocedure Evaluation (Signed)
  Anesthesia Post-op Note  Patient: Brenda Flowers  Procedure(s) Performed: Procedure(s) (LRB): ANAL FISTULOTOMY, EXCISION ANAL SKIN TAG (N/A)  Patient Location: PACU  Anesthesia Type: General  Level of Consciousness: awake and alert   Airway and Oxygen Therapy: Patient Spontanous Breathing  Post-op Pain: mild  Post-op Assessment: Post-op Vital signs reviewed, Patient's Cardiovascular Status Stable, Respiratory Function Stable, Patent Airway and No signs of Nausea or vomiting  Last Vitals:  Filed Vitals:   07/16/15 1345  BP: 114/74  Pulse: 54  Temp:   Resp: 14    Post-op Vital Signs: stable   Complications: No apparent anesthesia complications

## 2015-07-16 NOTE — Transfer of Care (Signed)
Immediate Anesthesia Transfer of Care Note  Patient: Brenda Flowers  Procedure(s) Performed: Procedure(s): ANAL FISTULOTOMY (N/A)  Patient Location: PACU  Anesthesia Type:General  Level of Consciousness: awake, alert , oriented and patient cooperative  Airway & Oxygen Therapy: Patient Spontanous Breathing and Patient connected to nasal cannula oxygen  Post-op Assessment: Report given to RN and Post -op Vital signs reviewed and stable  Post vital signs: Reviewed and stable  Last Vitals:  Filed Vitals:   07/16/15 1158  BP: 119/73  Pulse: 66  Temp: 36.8 C  Resp: 16    Complications: No apparent anesthesia complications

## 2015-07-16 NOTE — Anesthesia Procedure Notes (Signed)
Procedure Name: LMA Insertion Date/Time: 07/16/2015 12:46 PM Performed by: Tyrone NineSAUVE, Levonia Wolfley F Pre-anesthesia Checklist: Patient identified, Timeout performed, Emergency Drugs available, Suction available and Patient being monitored Patient Re-evaluated:Patient Re-evaluated prior to inductionOxygen Delivery Method: Circle system utilized Preoxygenation: Pre-oxygenation with 100% oxygen Intubation Type: IV induction Ventilation: Mask ventilation without difficulty LMA: LMA inserted LMA Size: 4.0 Number of attempts: 1 Airway Equipment and Method: Bite block Placement Confirmation: positive ETCO2 and breath sounds checked- equal and bilateral Tube secured with: Tape Dental Injury: Teeth and Oropharynx as per pre-operative assessment

## 2015-07-17 ENCOUNTER — Encounter (HOSPITAL_BASED_OUTPATIENT_CLINIC_OR_DEPARTMENT_OTHER): Payer: Self-pay | Admitting: General Surgery

## 2016-03-04 ENCOUNTER — Encounter: Payer: BLUE CROSS/BLUE SHIELD | Admitting: Sports Medicine

## 2019-10-13 ENCOUNTER — Ambulatory Visit: Payer: 59 | Attending: Internal Medicine

## 2019-10-13 DIAGNOSIS — Z20822 Contact with and (suspected) exposure to covid-19: Secondary | ICD-10-CM

## 2019-10-15 LAB — NOVEL CORONAVIRUS, NAA: SARS-CoV-2, NAA: NOT DETECTED

## 2019-11-05 ENCOUNTER — Ambulatory Visit: Payer: 59 | Attending: Internal Medicine

## 2019-11-05 DIAGNOSIS — Z23 Encounter for immunization: Secondary | ICD-10-CM | POA: Insufficient documentation

## 2019-11-05 NOTE — Progress Notes (Signed)
   Covid-19 Vaccination Clinic  Name:  Brenda Flowers    MRN: [id number] DOB: 02/26/1971  11/05/2019  [name] was observed post Covid-19 immunization for 15 minutes without incident. She was provided with Vaccine Information Sheet and instruction to access the V-Safe system.   [name] was instructed to call 911 with any severe reactions post vaccine: Marland Kitchen Difficulty breathing  . Swelling of face and throat  . A fast heartbeat  . A bad rash all over body  . Dizziness and weakness   Immunizations Administered    Name Date Dose VIS Date Route   Pfizer COVID-19 Vaccine 11/05/2019 12:47 PM 0.3 mL 08/11/2019 Intramuscular   Manufacturer: [employer], [employer]   Lot: [id number]   NDC: [address]-0

## 2019-11-26 ENCOUNTER — Ambulatory Visit: Payer: Self-pay | Attending: Internal Medicine

## 2019-11-26 DIAGNOSIS — Z23 Encounter for immunization: Secondary | ICD-10-CM

## 2019-11-26 NOTE — Progress Notes (Signed)
   Covid-19 Vaccination Clinic  Name:  Brenda Flowers    MRN: [id number] DOB: 01-09-1971  11/26/2019  [name] was observed post Covid-19 immunization for 15 minutes without incident. She was provided with Vaccine Information Sheet and instruction to access the V-Safe system.   [name] was instructed to call 911 with any severe reactions post vaccine: Marland Kitchen Difficulty breathing  . Swelling of face and throat  . A fast heartbeat  . A bad rash all over body  . Dizziness and weakness   Immunizations Administered    Name Date Dose VIS Date Route   Pfizer COVID-19 Vaccine 11/26/2019 11:16 AM 0.3 mL 08/11/2019 Intramuscular   Manufacturer: [employer], [employer]   Lot: [id number]   NDC: [address]-7

## 2020-05-24 ENCOUNTER — Telehealth: Payer: Self-pay

## 2020-05-24 NOTE — Telephone Encounter (Signed)
NOTES ON FILE FROM DAVID LOWE [phone] SENT REFERRAL TO SCHEDULING

## 2020-05-27 ENCOUNTER — Other Ambulatory Visit: Payer: Self-pay | Admitting: Obstetrics and Gynecology

## 2020-05-27 DIAGNOSIS — R928 Other abnormal and inconclusive findings on diagnostic imaging of breast: Secondary | ICD-10-CM

## 2020-05-31 ENCOUNTER — Ambulatory Visit
Admission: RE | Admit: 2020-05-31 | Discharge: 2020-05-31 | Disposition: A | Discharge status: Home / Self Care | Payer: No Typology Code available for payment source | Source: Ambulatory Visit | Attending: Obstetrics and Gynecology | Admitting: Obstetrics and Gynecology

## 2020-05-31 ENCOUNTER — Other Ambulatory Visit: Payer: Self-pay

## 2020-05-31 DIAGNOSIS — R928 Other abnormal and inconclusive findings on diagnostic imaging of breast: Secondary | ICD-10-CM

## 2020-06-10 ENCOUNTER — Other Ambulatory Visit: Payer: No Typology Code available for payment source

## 2020-06-19 ENCOUNTER — Other Ambulatory Visit: Payer: Self-pay

## 2020-06-19 ENCOUNTER — Ambulatory Visit: Payer: 59 | Admitting: Internal Medicine

## 2020-06-19 ENCOUNTER — Encounter: Payer: Self-pay | Admitting: Internal Medicine

## 2020-06-19 VITALS — BP 102/70 | HR 73 | Ht 68.0 in | Wt 156.0 lb

## 2020-06-19 DIAGNOSIS — E785 Hyperlipidemia, unspecified: Secondary | ICD-10-CM

## 2020-06-19 NOTE — Patient Instructions (Signed)
Medication Instructions:  Your physician recommends that you continue on your current medications as directed. Please refer to the Current Medication list given to you today.  * any changes will be based on calcium score results   *If you need a refill on your cardiac medications before your next appointment, please call your pharmacy*   Lab Work: FASTING lipid panel before your next visit with Dr. Rennis Golden   If you have labs (blood work) drawn today and your tests are completely normal, you will receive your results only by: Marland Kitchen MyChart Message (if you have MyChart) OR . A paper copy in the mail If you have any lab test that is abnormal or we need to change your treatment, we will call you to review the results.   Testing/Procedures: Dr. Rennis Golden has ordered a CT coronary calcium score. This test is done at [address] [address]. This is $150 out of pocket.   Coronary CalciumScan A coronary calcium scan is an imaging test used to look for deposits of calcium and other fatty materials (plaques) in the inner lining of the blood vessels of the heart (coronary arteries). These deposits of calcium and plaques can partly clog and narrow the coronary arteries without producing any symptoms or warning signs. This puts a person at risk for a heart attack. This test can detect these deposits before symptoms develop. Tell a health care provider about:  Any allergies you have.  All medicines you are taking, including vitamins, herbs, eye drops, creams, and over-the-counter medicines.  Any problems you or family members have had with anesthetic medicines.  Any blood disorders you have.  Any surgeries you have had.  Any medical conditions you have.  Whether you are pregnant or may be pregnant. What are the risks? Generally, this is a safe procedure. However, problems may occur, including:  Harm to a pregnant woman and her unborn baby. This test involves the use of radiation. Radiation  exposure can be dangerous to a pregnant woman and her unborn baby. If you are pregnant, you generally should not have this procedure done.  Slight increase in the risk of cancer. This is because of the radiation involved in the test. What happens before the procedure? No preparation is needed for this procedure. What happens during the procedure?  You will undress and remove any jewelry around your neck or chest.  You will put on a hospital gown.  Sticky electrodes will be placed on your chest. The electrodes will be connected to an electrocardiogram (ECG) machine to record a tracing of the electrical activity of your heart.  A CT scanner will take pictures of your heart. During this time, you will be asked to lie still and hold your breath for 2-3 seconds while a picture of your heart is being taken. The procedure may vary among health care providers and hospitals. What happens after the procedure?  You can get dressed.  You can return to your normal activities.  It is up to you to get the results of your test. Ask your health care provider, or the department that is doing the test, when your results will be ready. Summary  A coronary calcium scan is an imaging test used to look for deposits of calcium and other fatty materials (plaques) in the inner lining of the blood vessels of the heart (coronary arteries).  Generally, this is a safe procedure. Tell your health care provider if you are pregnant or may be pregnant.  No  preparation is needed for this procedure.  A CT scanner will take pictures of your heart.  You can return to your normal activities after the scan is done. This information is not intended to replace advice given to you by your health care provider. Make sure you discuss any questions you have with your health care provider. Document Released: 02/13/2008 Document Revised: 07/06/2016 Document Reviewed: 07/06/2016 Elsevier Interactive Patient Education  2017  [employer].     Follow-Up: At [facility], you and your health needs are our priority.  As part of our continuing mission to provide you with exceptional heart care, we have created designated Provider Care Teams.  These Care Teams include your primary Cardiologist (physician) and Advanced Practice Providers (APPs -  Physician Assistants and Nurse Practitioners) who all work together to provide you with the care you need, when you need it.  We recommend signing up for the patient portal called "MyChart".  Sign up information is provided on this After Visit Summary.  MyChart is used to connect with patients for Virtual Visits (Telemedicine).  Patients are able to view lab/test results, encounter notes, upcoming appointments, etc.  Non-urgent messages can be sent to your provider as well.   To learn more about what you can do with MyChart, go to [email or web address].    Your next appointment:   3-4 month(s) - lipid panel  The format for your next appointment:   In Person  Provider:   K. Italy Hilty, MD

## 2020-06-19 NOTE — Progress Notes (Signed)
LIPID CLINIC CONSULT NOTE  Chief Complaint:  Manage dyslipidemia  Primary Care Physician: [facility] [place]  Primary Cardiologist:  No primary care provider on file.  HPI:  Brenda Flowers is a 49 y.o. female who is being seen today for the evaluation of dyslipidemia at the request of Candice Camp, MD. This a pleasant 49 year old female kindly referred by Dr. Rana Snare for evaluation management of dyslipidemia. She has very little past medical history. She does report family history of dyslipidemia in both her mother and father and their siblings. Her father had a history of myocarditis and apparently had open heart surgery. Mom's cholesterol was about 130-140 without treatment. She had never had cholesterol testing until recently. Overall she has felt healthy. She is a runner and exercises regularly. Until recently before Covid she had been running a little more regularly and subsequently a little less but has noticed a decrease in her running times. She had a couple episodes apparently over the summer where she fell off in her head and thought she might have Covid but never tested positive for it. She has since resumed running and denies any symptoms with exercise that shortness of breath or chest pain. She denies any palpitations. Her only medications include a birth control pill and Ambien which she takes at night for sleep. She is recently made dietary changes as well to cut out saturated fats. Since then she is actually lost an additional 7 pounds. Weight is appropriate. Blood pressure is very low. Heart rates in the 70s but she says typically is in the 50s. She had a recent lipid profile which showed a elevated total cholesterol 291, HDL 83, LDL 170 triglycerides 208. We discussed the ideal cholesterol numbers for her and obviously these values are high. It is difficult to know whether her high HDL cholesterol of 83 is cardioprotective.  PMHx:  Past Medical History:   Diagnosis Date  . Anal fistula   . Family history of adverse reaction to anesthesia    daughter-- ponv    Past Surgical History:  Procedure Laterality Date  . ANAL FISTULOTOMY N/A 07/16/2015   Procedure: ANAL FISTULOTOMY, EXCISION ANAL SKIN TAG;  Surgeon: Avel Peace, MD;  Location: [facility];  Service: General;  Laterality: N/A;  . DILATION AND EVACUATION  07-13-2001  . INCISION AND DRAINAGE PERIRECTAL ABSCESS N/A 02/18/2015   Procedure: IRRIGATION AND DEBRIDEMENT PERIRECTAL ABSCESS, PLACEMENT OF A  SETON;  Surgeon: Avel Peace, MD;  Location: [facility];  Service: General;  Laterality: N/A;  . WISDOM TOOTH EXTRACTION      FAMHx:  Family History  Problem Relation Age of Onset  . Cancer Father   . Hyperlipidemia Father   . Cancer Maternal Grandmother   . Cancer Maternal Grandfather     SOCHx:   reports that she has never smoked. She has never used smokeless tobacco. She reports current alcohol use. She reports that she does not use drugs.  ALLERGIES:  Allergies  Allergen Reactions  . Hydrocodone-Acetaminophen Nausea And Vomiting    ROS: Pertinent items noted in HPI and remainder of comprehensive ROS otherwise negative.  HOME MEDS: Current Outpatient Medications on File Prior to Visit  Medication Sig Dispense Refill  . acetaminophen (TYLENOL) 500 MG tablet Take 1,000 mg by mouth every 6 (six) hours as needed for moderate pain.    . drospirenone-ethinyl estradiol (YAZ) 3-0.02 MG tablet Take 1 tablet by mouth daily.    Marland Kitchen ibuprofen (ADVIL,MOTRIN) 200 MG  tablet Take 800 mg by mouth every 6 (six) hours as needed for moderate pain.    Marland Kitchen ondansetron (ZOFRAN) 4 MG tablet Take 1 tablet (4 mg total) by mouth every 8 (eight) hours as needed for nausea. 20 tablet 0  . zolpidem (AMBIEN) 10 MG tablet Take 10 mg by mouth at bedtime. Pt takes 1/2 tablet     No current facility-administered medications on file prior to visit.    LABS/IMAGING: No results found for  this or any previous visit (from the past 48 hour(s)). No results found.  LIPID PANEL: No results found for: CHOL, TRIG, HDL, CHOLHDL, VLDL, LDLCALC, LDLDIRECT  WEIGHTS: Wt Readings from Last 3 Encounters:  06/19/20 156 lb (70.8 kg)  07/16/15 152 lb (68.9 kg)  02/18/15 149 lb 7.6 oz (67.8 kg)    VITALS: BP 102/70   Pulse 73   Ht 5\' 8"  (1.727 m)   Wt 156 lb (70.8 kg)   SpO2 99%   BMI 23.72 kg/m   EXAM: General appearance: alert and no distress Neck: no carotid bruit, no JVD and thyroid not enlarged, symmetric, no tenderness/mass/nodules Lungs: clear to auscultation bilaterally Heart: regular rate and rhythm, S1, S2 normal, no murmur, click, rub or gallop Abdomen: soft, non-tender; bowel sounds normal; no masses,  no organomegaly Extremities: extremities normal, atraumatic, no cyanosis or edema Pulses: 2+ and symmetric Skin: Skin color, texture, turgor normal. No rashes or lesions Neurologic: Grossly normal Psych: Pleasant   *Examination chaperoned by , RN.  EKG: Deferred  ASSESSMENT: 1. Mixed dyslipidemia with high LDL, HDL and triglycerides 2. Family history of dyslipidemia and coronary disease in her father  PLAN: 1.   Ms. Patch has a mixed dyslipidemia with a high HDL cholesterol. Is possible this could be cardioprotective for her although there is high cholesterol in her family. She has had no history of coronary events. Diet is appropriate and she exercises regularly. Overall she is considered a very low traditional cardiovascular in my opinion. I would however like to get a coronary calcium score to evaluate for any possible early onset cardiovascular disease. If her score is 0 or quite low then we may continue to monitor her or perhaps consider low-dose statin therapy based on her percentile risk. She is agreeable to this plan.  Thanks as always for the kind referral.  Bing Ree, MD, Chrystie Nose  [place]  [facility]  Medical Director  of the [facility] Diplomate of the American Board of Clinical Lipidology Attending Cardiologist  Direct Dial: [phone]  Fax: [phone]  Website:  [phone] Sehar Sedano 06/19/2020, 9:32 AM

## 2020-06-24 ENCOUNTER — Ambulatory Visit (INDEPENDENT_AMBULATORY_CARE_PROVIDER_SITE_OTHER)
Admission: RE | Admit: 2020-06-24 | Discharge: 2020-06-24 | Disposition: A | Discharge status: Home / Self Care | Payer: Self-pay | Source: Ambulatory Visit | Attending: Internal Medicine | Admitting: Internal Medicine

## 2020-06-24 ENCOUNTER — Other Ambulatory Visit: Payer: Self-pay

## 2020-06-24 DIAGNOSIS — E785 Hyperlipidemia, unspecified: Secondary | ICD-10-CM

## 2020-09-09 ENCOUNTER — Ambulatory Visit: Payer: 59 | Admitting: Internal Medicine

## 2020-09-23 ENCOUNTER — Other Ambulatory Visit: Payer: 59

## 2020-10-25 ENCOUNTER — Ambulatory Visit: Payer: 59 | Admitting: Internal Medicine

## 2020-11-26 ENCOUNTER — Ambulatory Visit: Payer: 59 | Admitting: Internal Medicine

## 2021-01-07 ENCOUNTER — Telehealth: Payer: 59 | Admitting: Internal Medicine

## 2021-08-12 ENCOUNTER — Other Ambulatory Visit: Payer: Self-pay | Admitting: Obstetrics and Gynecology

## 2021-08-12 DIAGNOSIS — R928 Other abnormal and inconclusive findings on diagnostic imaging of breast: Secondary | ICD-10-CM

## 2021-09-11 ENCOUNTER — Ambulatory Visit: Payer: 59

## 2021-09-11 ENCOUNTER — Ambulatory Visit
Admission: RE | Admit: 2021-09-11 | Discharge: 2021-09-11 | Disposition: A | Discharge status: Home / Self Care | Payer: 59 | Source: Ambulatory Visit | Attending: Obstetrics and Gynecology | Admitting: Obstetrics and Gynecology

## 2021-09-11 DIAGNOSIS — R928 Other abnormal and inconclusive findings on diagnostic imaging of breast: Secondary | ICD-10-CM

## 2023-03-22 IMAGING — MG MM DIGITAL DIAGNOSTIC UNILAT*R* W/ TOMO W/ CAD
4 series · 4 of 12 positions shown · non-contrast
Comparison: Previous exam(s).

CLINICAL DATA: Patient recalled from screening for right breast
asymmetry.

EXAM:
DIGITAL DIAGNOSTIC UNILATERAL RIGHT MAMMOGRAM WITH TOMOSYNTHESIS AND
CAD
TECHNIQUE: Right digital diagnostic mammography and breast tomosynthesis was
performed. The images were evaluated with computer-aided detection.

[R MLO synth-2D]
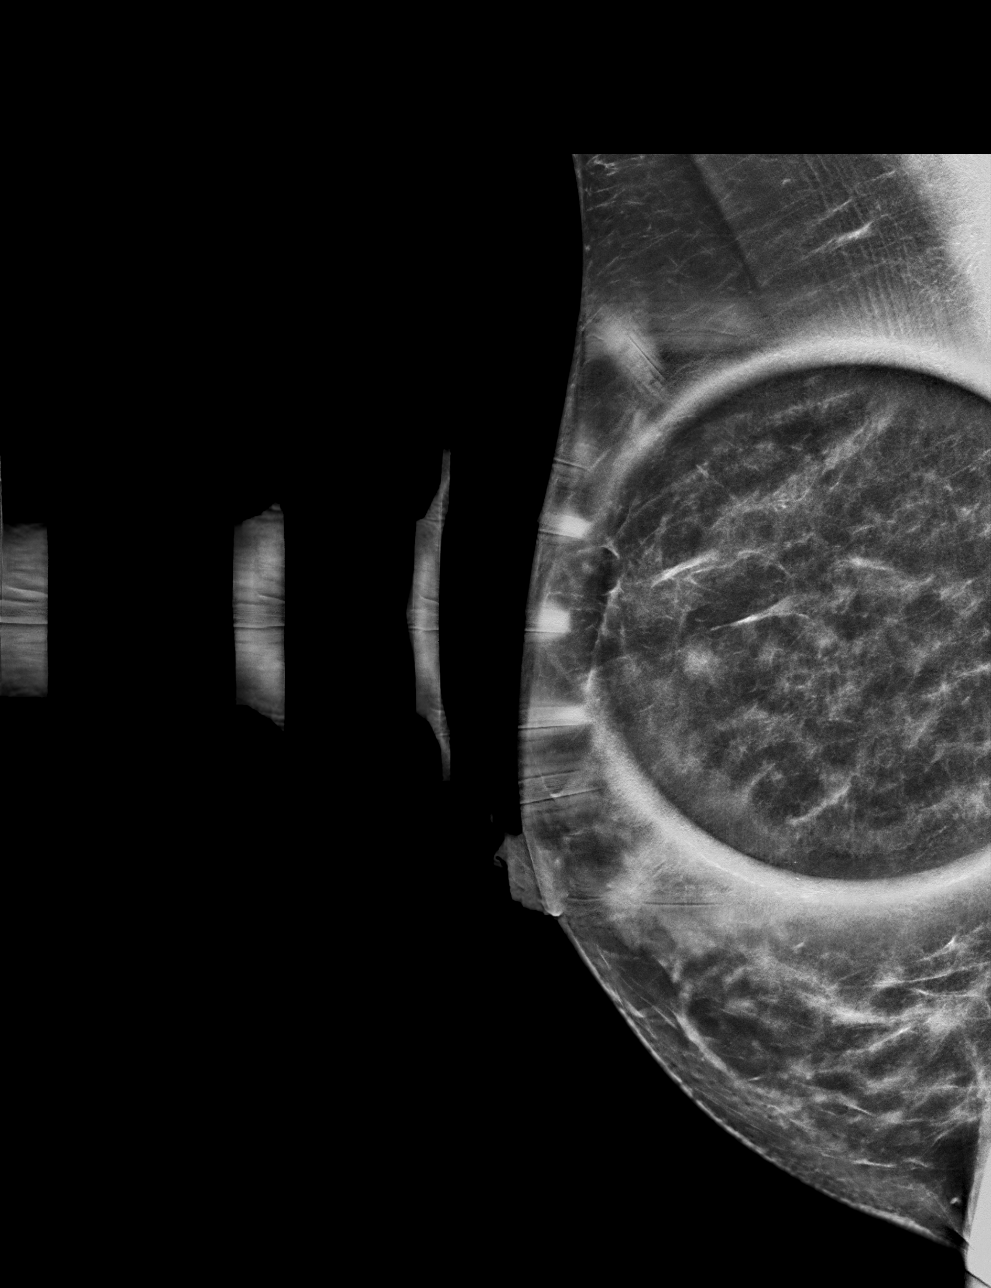

[R ML synth-2D]
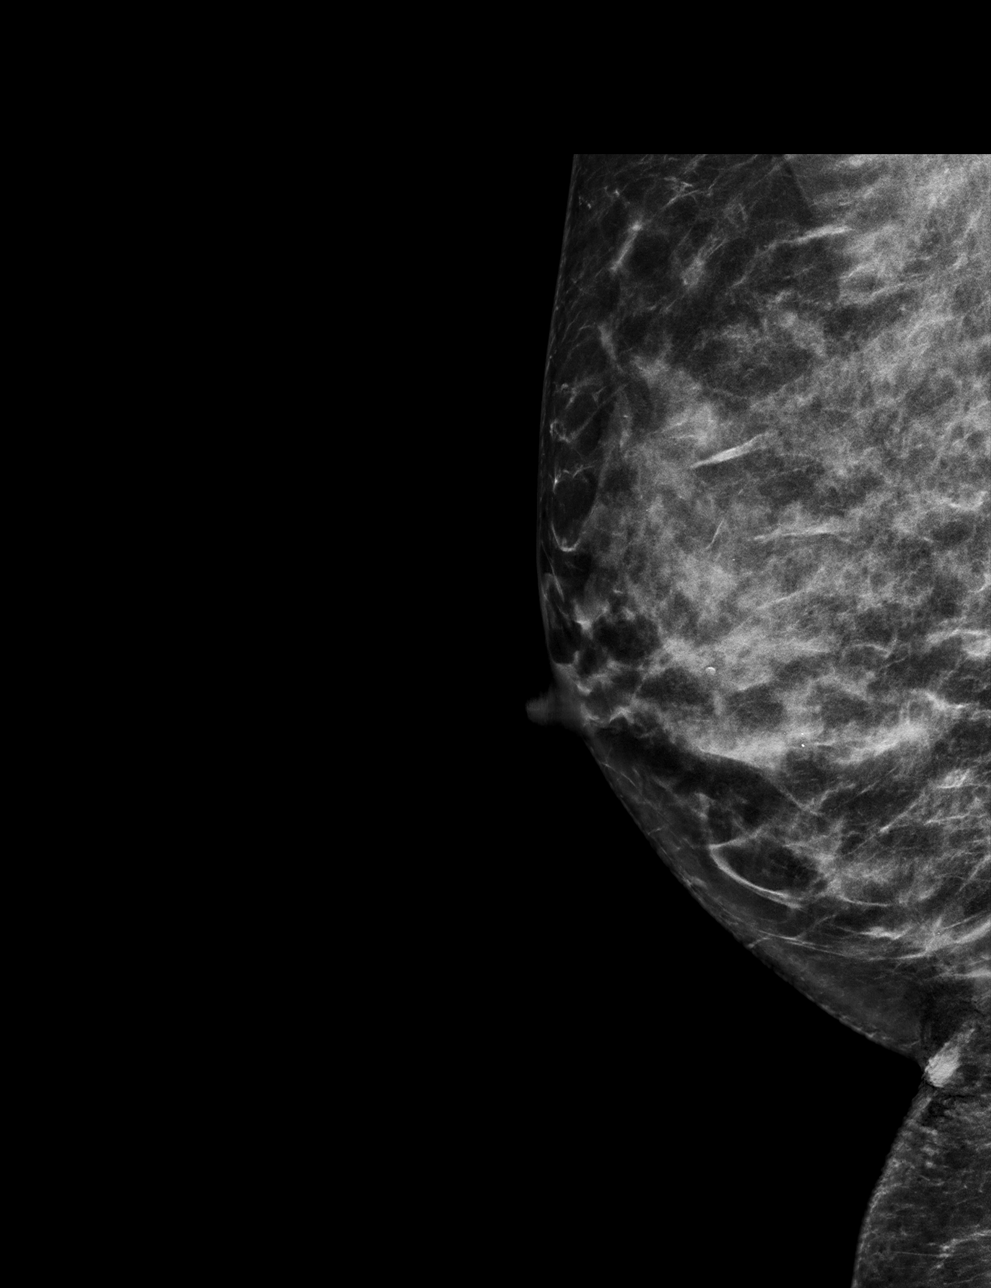

[R MLO tomo · tomo slice 37/72.0]
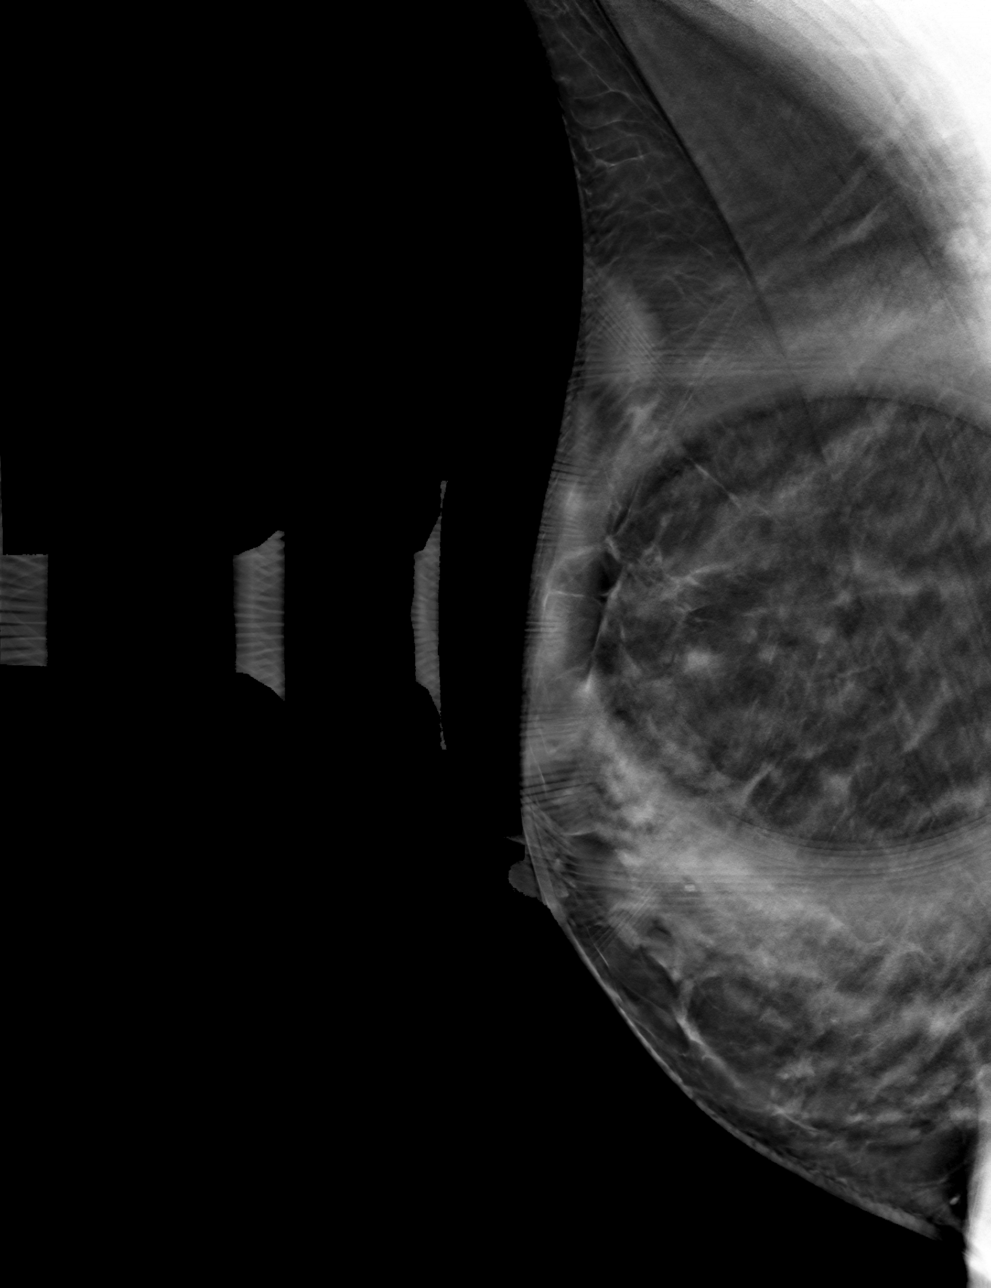

[R ML tomo · tomo slice 35/68.0]
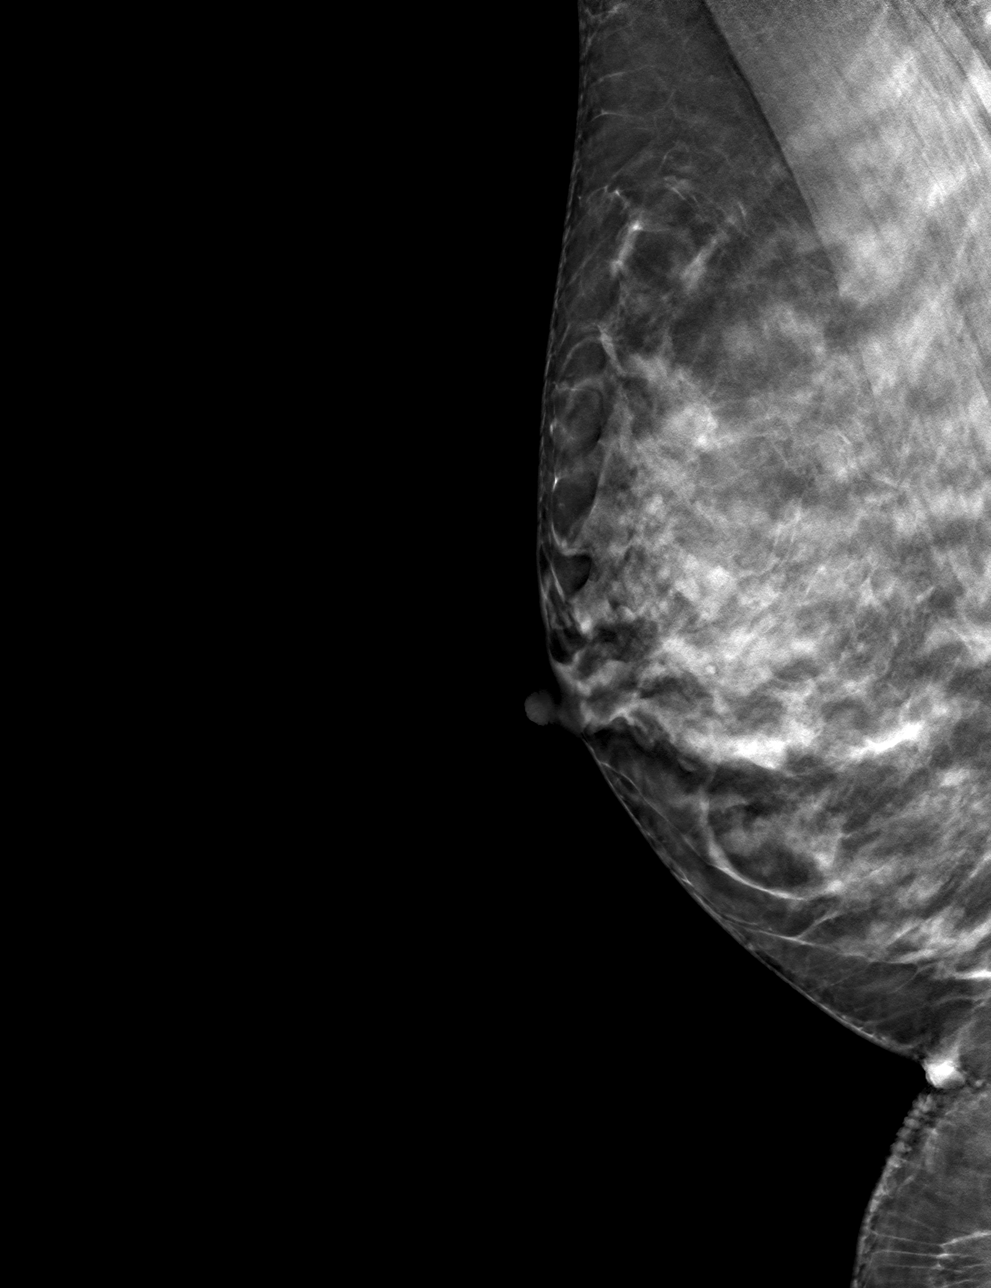

[4 of 12 positions shown; findings below may reference images not displayed]

ACR Breast Density Category d: The breast tissue is extremely dense,
which lowers the sensitivity of mammography.
FINDINGS: Questioned asymmetry within the right breast resolved with
additional imaging compatible with dense overlapping fibroglandular
tissue. No suspicious findings.
IMPRESSION: No mammographic evidence for malignancy.

RECOMMENDATION:
Screening mammogram in one year.(Code:15-P-QBD)

I have discussed the findings and recommendations with the patient.
If applicable, a reminder letter will be sent to the patient
regarding the next appointment.

BI-RADS CATEGORY  1: Negative.
